# Patient Record
Sex: Male | Born: 1960 | Race: White | Hispanic: No | Marital: Married | Smoking: Never smoker
Health system: Southern US, Community
[De-identification: ages and names within clinical notes are randomized; demographics above are authoritative.]

## PROBLEM LIST (undated history)

## (undated) DIAGNOSIS — E119 Type 2 diabetes mellitus without complications: Secondary | ICD-10-CM

## (undated) DIAGNOSIS — E7849 Other hyperlipidemia: Principal | ICD-10-CM

## (undated) DIAGNOSIS — Z794 Long term (current) use of insulin: Secondary | ICD-10-CM

## (undated) DIAGNOSIS — B349 Viral infection, unspecified: Principal | ICD-10-CM

## (undated) DIAGNOSIS — E113599 Type 2 diabetes mellitus with proliferative diabetic retinopathy without macular edema, unspecified eye: Secondary | ICD-10-CM

## (undated) DIAGNOSIS — I10 Essential (primary) hypertension: Secondary | ICD-10-CM

## (undated) DIAGNOSIS — E1165 Type 2 diabetes mellitus with hyperglycemia: Secondary | ICD-10-CM

## (undated) DIAGNOSIS — I639 Cerebral infarction, unspecified: Secondary | ICD-10-CM

## (undated) DIAGNOSIS — C169 Malignant neoplasm of stomach, unspecified: Secondary | ICD-10-CM

## (undated) DIAGNOSIS — E785 Hyperlipidemia, unspecified: Secondary | ICD-10-CM

## (undated) DIAGNOSIS — Z9889 Other specified postprocedural states: Secondary | ICD-10-CM

## (undated) DIAGNOSIS — M109 Gout, unspecified: Secondary | ICD-10-CM

## (undated) DIAGNOSIS — I629 Nontraumatic intracranial hemorrhage, unspecified: Secondary | ICD-10-CM

## (undated) HISTORY — PX: VASECTOMY: SHX75

## (undated) HISTORY — DX: Type 2 diabetes mellitus without complications: E11.9

## (undated) HISTORY — PX: TONSILLECTOMY: SUR1361

## (undated) HISTORY — DX: Cerebral infarction, unspecified: I63.9

## (undated) HISTORY — DX: Essential (primary) hypertension: I10

## (undated) HISTORY — DX: Hyperlipidemia, unspecified: E78.5

## (undated) HISTORY — DX: Gout, unspecified: M10.9

---

## 1991-11-27 HISTORY — PX: CRANIOTOMY: SHX93

## 2013-10-27 DIAGNOSIS — I10 Essential (primary) hypertension: Secondary | ICD-10-CM

## 2013-10-27 DIAGNOSIS — E119 Type 2 diabetes mellitus without complications: Secondary | ICD-10-CM

## 2013-10-27 DIAGNOSIS — I639 Cerebral infarction, unspecified: Secondary | ICD-10-CM

## 2013-10-27 HISTORY — DX: Type 2 diabetes mellitus without complications: E11.9

## 2013-10-27 HISTORY — DX: Essential (primary) hypertension: I10

## 2013-10-27 HISTORY — DX: Cerebral infarction, unspecified: I63.9

## 2013-11-06 ENCOUNTER — Emergency Department (HOSPITAL_COMMUNITY): Payer: BC Managed Care – PPO

## 2013-11-06 ENCOUNTER — Inpatient Hospital Stay (HOSPITAL_COMMUNITY)
Admission: EM | Admit: 2013-11-06 | Discharge: 2013-11-08 | DRG: 066 | Disposition: A | Discharge status: Home / Self Care | Payer: BC Managed Care – PPO | Attending: Internal Medicine | Admitting: Internal Medicine

## 2013-11-06 ENCOUNTER — Inpatient Hospital Stay (HOSPITAL_COMMUNITY): Payer: BC Managed Care – PPO

## 2013-11-06 ENCOUNTER — Encounter (HOSPITAL_COMMUNITY): Payer: Self-pay | Admitting: Emergency Medicine

## 2013-11-06 DIAGNOSIS — E119 Type 2 diabetes mellitus without complications: Secondary | ICD-10-CM | POA: Diagnosis present

## 2013-11-06 DIAGNOSIS — IMO0002 Reserved for concepts with insufficient information to code with codable children: Secondary | ICD-10-CM | POA: Diagnosis present

## 2013-11-06 DIAGNOSIS — Z79899 Other long term (current) drug therapy: Secondary | ICD-10-CM | POA: Diagnosis not present

## 2013-11-06 DIAGNOSIS — I6330 Cerebral infarction due to thrombosis of unspecified cerebral artery: Secondary | ICD-10-CM

## 2013-11-06 DIAGNOSIS — E1165 Type 2 diabetes mellitus with hyperglycemia: Secondary | ICD-10-CM

## 2013-11-06 DIAGNOSIS — IMO0001 Reserved for inherently not codable concepts without codable children: Secondary | ICD-10-CM

## 2013-11-06 DIAGNOSIS — I10 Essential (primary) hypertension: Secondary | ICD-10-CM

## 2013-11-06 DIAGNOSIS — I6350 Cerebral infarction due to unspecified occlusion or stenosis of unspecified cerebral artery: Principal | ICD-10-CM | POA: Diagnosis present

## 2013-11-06 DIAGNOSIS — Z823 Family history of stroke: Secondary | ICD-10-CM | POA: Diagnosis not present

## 2013-11-06 DIAGNOSIS — I63349 Cerebral infarction due to thrombosis of unspecified cerebellar artery: Secondary | ICD-10-CM

## 2013-11-06 DIAGNOSIS — I639 Cerebral infarction, unspecified: Secondary | ICD-10-CM | POA: Diagnosis present

## 2013-11-06 DIAGNOSIS — H532 Diplopia: Secondary | ICD-10-CM | POA: Diagnosis present

## 2013-11-06 DIAGNOSIS — Z7982 Long term (current) use of aspirin: Secondary | ICD-10-CM | POA: Diagnosis not present

## 2013-11-06 HISTORY — DX: Other specified postprocedural states: Z98.890

## 2013-11-06 LAB — CBC
HCT: 39.5 % (ref 39.0–52.0)
HEMOGLOBIN: 14 g/dL (ref 13.0–17.0)
MCH: 31.5 pg (ref 26.0–34.0)
MCHC: 35.4 g/dL (ref 30.0–36.0)
MCV: 89 fL (ref 78.0–100.0)
Platelets: 247 10*3/uL (ref 150–400)
RBC: 4.44 MIL/uL (ref 4.22–5.81)
RDW: 12.6 % (ref 11.5–15.5)
WBC: 6.5 10*3/uL (ref 4.0–10.5)

## 2013-11-06 LAB — I-STAT CHEM 8, ED
BUN: 8 mg/dL (ref 6–23)
CHLORIDE: 104 meq/L (ref 96–112)
Calcium, Ion: 1.13 mmol/L (ref 1.12–1.23)
Creatinine, Ser: 0.8 mg/dL (ref 0.50–1.35)
GLUCOSE: 156 mg/dL — AB (ref 70–99)
HCT: 46 % (ref 39.0–52.0)
Hemoglobin: 15.6 g/dL (ref 13.0–17.0)
POTASSIUM: 3.9 meq/L (ref 3.7–5.3)
Sodium: 139 mEq/L (ref 137–147)
TCO2: 25 mmol/L (ref 0–100)

## 2013-11-06 LAB — CBG MONITORING, ED: Glucose-Capillary: 145 mg/dL — ABNORMAL HIGH (ref 70–99)

## 2013-11-06 LAB — I-STAT TROPONIN, ED: Troponin i, poc: 0 ng/mL (ref 0.00–0.08)

## 2013-11-06 MED ORDER — SODIUM CHLORIDE 0.9 % IV SOLN
INTRAVENOUS | Status: DC
  Administered 2013-11-06 – 2013-11-07 (×3): via INTRAVENOUS

## 2013-11-06 MED ORDER — STROKE: EARLY STAGES OF RECOVERY BOOK
Freq: Once | Status: DC
  Filled 2013-11-06: qty 1

## 2013-11-06 MED ORDER — HYDRALAZINE HCL 20 MG/ML IJ SOLN: 10.0000 mg | Freq: Four times a day (QID) | INTRAMUSCULAR | Status: DC | PRN

## 2013-11-06 MED ORDER — HEPARIN SODIUM (PORCINE) 5000 UNIT/ML IJ SOLN
5000.0000 [IU] | Freq: Three times a day (TID) | INTRAMUSCULAR | Status: DC
  Administered 2013-11-06 – 2013-11-08 (×5): 5000 [IU] via SUBCUTANEOUS
  Filled 2013-11-06 (×5): qty 1

## 2013-11-06 MED ORDER — ASPIRIN EC 81 MG PO TBEC
81.0000 mg | DELAYED_RELEASE_TABLET | Freq: Every day | ORAL | Status: DC
  Administered 2013-11-07 – 2013-11-08 (×2): 81 mg via ORAL
  Filled 2013-11-06 (×3): qty 1

## 2013-11-06 NOTE — ED Notes (Signed)
Pt on MRI unable to assess pt no on his room.

## 2013-11-06 NOTE — ED Notes (Signed)
Face appears .  He has a sl lt arm drft

## 2013-11-06 NOTE — Consult Note (Signed)
Referring Physician: ED    Chief Complaint: code stroke, acute onset diplopia  HPI:                                                                                                                                         Jacob Underwood is an 53 y.o. male with a past medical history that is relevant for HTN, s/p removal right SDH years ago, brought in as due to acute onset diplopia and code stroke was called. He said that he was doing well until about 1 pm today when he got out of his car and started having double vision that will go away after closing one eye. No associated vertigo, imbalance, difficulty swallowing, focal weakness or numbness, slurred speech, confusion, language impairment, HA, CP, SOB, or palpitations. He called his wife on the phone around 3 pm and she said that " he sound it terrible" but he was not slurring his words. Upon arrival to the ED he was still having diplopia but NIHSS 0 and CT brain showed no acute intracranial abnormality.  Date last known well:  Time last known well:  tPA Given: no, still with diplopia but NIHSS 0. Prior brain surgery excludes him from thrombolysis. NIHSS: 0   Past Medical History  Diagnosis Date  . H/O craniotomy     History reviewed. No pertinent past surgical history.  No family history on file. Social History:  reports that he has never smoked. He does not have any smokeless tobacco history on file. He reports that he does not drink alcohol. His drug history is not on file.  Allergies: Allergies not on file  Medications:                                                                                                                           I have reviewed the patient's current medications.  ROS:  History obtained from the patient, wife, and chart review.  General ROS: negative for - chills, fatigue,  fever, night sweats, or weight loss Psychological ROS: negative for - behavioral disorder, hallucinations, memory difficulties, mood swings or suicidal ideation Ophthalmic ROS: negative for - blurry vision, eye pain or loss of vision ENT ROS: negative for - epistaxis, nasal discharge, oral lesions, sore throat, tinnitus or vertigo Allergy and Immunology ROS: negative for - hives or itchy/watery eyes Hematological and Lymphatic ROS: negative for - bleeding problems, bruising or swollen lymph nodes Endocrine ROS: negative for - galactorrhea, hair pattern changes, polydipsia/polyuria or temperature intolerance Respiratory ROS: negative for - cough, hemoptysis, shortness of breath or wheezing Cardiovascular ROS: negative for - chest pain, dyspnea on exertion, edema or irregular heartbeat Gastrointestinal ROS: negative for - abdominal pain, diarrhea, hematemesis, nausea/vomiting or stool incontinence Genito-Urinary ROS: negative for - dysuria, hematuria, incontinence or urinary frequency/urgency Musculoskeletal ROS: negative for - joint swelling or muscular weakness Neurological ROS: as noted in HPI Dermatological ROS: negative for rash and skin lesion changes  Physical exam: pleasant male in no apparent distress. Blood pressure 152/92, pulse 68, temperature 97.9 F (36.6 C), temperature source Oral, resp. rate 15, height 6' (1.829 m), weight 109.317 kg (241 lb), SpO2 96.00%. Head: normocephalic. Neck: supple, no bruits, no JVD. Cardiac: no murmurs. Lungs: clear. Abdomen: soft, no tender, no mass. Extremities: no edema. Neurologic Examination:                                                                                                      General: Mental Status: Alert, oriented, thought content appropriate.  Speech fluent without evidence of aphasia.  Able to follow 3 step commands without difficulty. Cranial Nerves: II: Discs flat bilaterally; Visual fields grossly normal, pupils equal,  round, reactive to light and accommodation III,IV, VI: ptosis not present, extra-ocular motions intact bilaterally V,VII: smile symmetric, facial light touch sensation normal bilaterally VIII: hearing normal bilaterally IX,X: gag reflex present XI: bilateral shoulder shrug XII: midline tongue extension without atrophy or fasciculations Motor: Right : Upper extremity   5/5    Left:     Upper extremity   5/5  Lower extremity   5/5     Lower extremity   5/5 Tone and bulk:normal tone throughout; no atrophy noted Sensory: Pinprick and light touch intact throughout, bilaterally Deep Tendon Reflexes:  1 all over Plantars: Right: downgoing   Left: downgoing Cerebellar: normal finger-to-nose,  normal heel-to-shin test Gait:  No tested    Results for orders placed during the hospital encounter of 11/06/13 (from the past 48 hour(s))  I-STAT CHEM 8, ED     Status: Abnormal   Collection Time    11/06/13  5:23 PM      Result Value Ref Range   Sodium 139  137 - 147 mEq/L   Potassium 3.9  3.7 - 5.3 mEq/L   Chloride 104  96 - 112 mEq/L   BUN 8  6 - 23 mg/dL   Creatinine, Ser 0.80  0.50 - 1.35 mg/dL   Glucose, Bld 156 (*)  70 - 99 mg/dL   Calcium, Ion 1.13  1.12 - 1.23 mmol/L   TCO2 25  0 - 100 mmol/L   Hemoglobin 15.6  13.0 - 17.0 g/dL   HCT 46.0  39.0 - 52.0 %   Ct Head Wo Contrast  11/06/2013   CLINICAL DATA:  53 year old male with blurred and double vision -code stroke.  EXAM: CT HEAD WITHOUT CONTRAST  TECHNIQUE: Contiguous axial images were obtained from the base of the skull through the vertex without intravenous contrast.  COMPARISON:  None.  FINDINGS: No intracranial abnormalities are identified, including mass lesion or mass effect, hydrocephalus, extra-axial fluid collection, midline shift, hemorrhage, or acute infarction.  Right craniotomy changes noted. No acute bony abnormalities identified.  IMPRESSION: No evidence of intracranial abnormality.  Critical Value/emergent results were  called by telephone at the time of interpretation on 11/06/2013 at 5:25 pm to Dr. Leonard Schwartz , who verbally acknowledged these results.   Electronically Signed   By: Hassan Rowan M.D.   On: 11/06/2013 17:25    Assessment: 53 y.o. male with acute onset binocular diplopia. NIHSS 0. CT brain unremarkable. Concern for brainstem infarct. Admit to medicine and complete stroke work up. Aspirin 81 mg daily after passing swallowing evaluation. Stroke team will follow up tomorrow.  Stroke Risk Factors - HTN  Plan: 1. HgbA1c, fasting lipid panel 2. MRI, MRA  of the brain without contrast 3. Echocardiogram 4. Carotid dopplers 5. Prophylactic therapy-aspirin 6. Risk factor modification 7. Telemetry monitoring 8. Frequent neuro checks 9. PT/OT SLP  Dorian Pod ,MD [facility] [phone]  11/06/2013, 5:32 PM

## 2013-11-06 NOTE — ED Provider Notes (Signed)
CSN: [id number]     Arrival date & time 11/06/13  1651 History   First MD Initiated Contact with Patient 11/06/13 1708     Chief Complaint  Patient presents with  . Code Stroke    @EDPCLEARED @  HPI Patient had sudden onset of double vision which began approximately 4 hours prior to his arrival numbers department.  No previous history in the past.  Otherwise patient has no complaints of weakness or neurological deficits. Past Medical History  Diagnosis Date  . H/O craniotomy    History reviewed. No pertinent past surgical history. History reviewed. No pertinent family history. History  Substance Use Topics  . Smoking status: Never Smoker   . Smokeless tobacco: Not on file  . Alcohol Use: No    Review of Systems  Unable to perform ROS: Acuity of condition     Allergies  Review of patient's allergies indicates not on file.  Home Medications   Prior to Admission medications   Not on File   BP 143/84  Pulse 71  Temp(Src) 98 F (36.7 C) (Oral)  Resp 18  Ht 6' (1.829 m)  Wt 251 lb 15.8 oz (114.3 kg)  BMI 34.17 kg/m2  SpO2 97% Physical Exam Physical Exam  Nursing note and vitals reviewed. Constitutional: He is oriented to person, place, and time. He appears well-developed and well-nourished. No distress.  HENT:  Head: Normocephalic and atraumatic.  Eyes: Pupils are equal, round, and reactive to light.  Neck: Normal range of motion.  Cardiovascular: Normal rate and intact distal pulses.   Pulmonary/Chest: No respiratory distress.  Abdominal: Normal appearance. He exhibits no distension.  Musculoskeletal: Normal range of motion.  Neurological: He is alert and oriented to person, place, and time.  Patient has diplopia which is acute in onset.  Diplopia is through all range of motion of external eye movements.  Patient has no motor or sensory weakness.  Patient has no speech involvement.  Skin: Skin is warm and dry. No rash noted.  Psychiatric: He has a normal mood and  affect. His behavior is normal.   ED Course  Procedures (including critical care time)  Code stroke was called.  Patient seen and evaluated by neurology  CRITICAL CARE Performed by: Ly Bacchi L Total critical care time: 30 min Critical care time was exclusive of separately billable procedures and treating other patients. Critical care was necessary to treat or prevent imminent or life-threatening deterioration. Critical care was time spent personally by me on the following activities: development of treatment plan with patient and/or surrogate as well as nursing, discussions with consultants, evaluation of patient's response to treatment, examination of patient, obtaining history from patient or surrogate, ordering and performing treatments and interventions, ordering and review of laboratory studies, ordering and review of radiographic studies, pulse oximetry and re-evaluation of patient's condition.  Labs Review Labs Reviewed  HEMOGLOBIN A1C - Abnormal; Notable for the following:    Hemoglobin A1C 9.4 (*)    Mean Plasma Glucose 223 (*)    All other components within normal limits  LIPID PANEL - Abnormal; Notable for the following:    Triglycerides 373 (*)    HDL 26 (*)    VLDL 75 (*)    All other components within normal limits  I-STAT CHEM 8, ED - Abnormal; Notable for the following:    Glucose, Bld 156 (*)    All other components within normal limits  CBG MONITORING, ED - Abnormal; Notable for the following:    Glucose-Capillary 145 (*)  All other components within normal limits  CBC  CBC  CREATININE, SERUM  HEMOGLOBIN A1C  I-STAT TROPOININ, ED    Imaging Review Dg Chest 2 View  11/07/2013   CLINICAL DATA:  Blurry vision.  Possible stroke.  EXAM: CHEST  2 VIEW  COMPARISON:  None.  FINDINGS: The cardiac silhouette, mediastinal and hilar contours are normal. The lungs are clear. No pleural effusion. The bony thorax is intact.  IMPRESSION: No acute cardiopulmonary  findings.   Electronically Signed   By: Kalman Jewels M.D.   On: 11/07/2013 00:48   Ct Head Wo Contrast  11/06/2013   CLINICAL DATA:  53 year old male with blurred and double vision -code stroke.  EXAM: CT HEAD WITHOUT CONTRAST  TECHNIQUE: Contiguous axial images were obtained from the base of the skull through the vertex without intravenous contrast.  COMPARISON:  None.  FINDINGS: No intracranial abnormalities are identified, including mass lesion or mass effect, hydrocephalus, extra-axial fluid collection, midline shift, hemorrhage, or acute infarction.  Right craniotomy changes noted. No acute bony abnormalities identified.  IMPRESSION: No evidence of intracranial abnormality.  Critical Value/emergent results were called by telephone at the time of interpretation on 11/06/2013 at 5:25 pm to Dr. Leonard Schwartz , who verbally acknowledged these results.   Electronically Signed   By: Hassan Rowan M.D.   On: 11/06/2013 17:25   Mr Jodene Nam Head Wo Contrast  11/07/2013   ADDENDUM REPORT: 11/07/2013 09:48  ADDENDUM: After further review of the images, there is a small 3 mm acute infarct in the right posterior midbrain just below the aqueduct. This is consistent with the patient's blurred vision and diplopia.  These results were called by telephone at the time of interpretation on 11/07/2013 at 9:47 am to Dr. Freddy Jaksch , who verbally acknowledged these results.   Electronically Signed   By: Franchot Gallo M.D.   On: 11/07/2013 09:48   11/07/2013   CLINICAL DATA:  Stroke.  Blurred vision.  EXAM: MRI HEAD WITHOUT CONTRAST  MRA HEAD WITHOUT CONTRAST  TECHNIQUE: Multiplanar, multiecho pulse sequences of the brain and surrounding structures were obtained without intravenous contrast. Angiographic images of the head were obtained using MRA technique without contrast.  COMPARISON:  CT head 11/06/2013  FINDINGS: MRI HEAD FINDINGS  Negative for acute infarct  Negative for chronic ischemia  Negative for demyelinating disease.  Negative  for hemorrhage or fluid collection. Negative for mass or edema.  Pituitary normal in size.  Cranial cervical junction normal  Paranasal sinuses are clear.  MRA HEAD FINDINGS  Left vertebral artery widely patent to the basilar. Left PICA patent. Right vertebral artery is hypoplastic and ends in PICA. Basilar widely patent. Fetal origin left posterior cerebral artery with hypoplastic left P1 segment. Both posterior cerebral arteries are patent without stenosis  Internal carotid artery patent bilaterally. Anterior and middle cerebral arteries are patent without significant stenosis.  Negative for cerebral aneurysm.  IMPRESSION: Negative for acute infarct.  Negative MRI of the head  Negative MRA of the head.  Electronically Signed: By: Franchot Gallo M.D. 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 This is consistent with the patient's blurred vision and diplopia.  These results were called by telephone at the time of interpretation on 11/07/2013 at 9:47 am to Dr. Freddy Jaksch , who verbally acknowledged  these results.   Electronically Signed   By: Franchot Gallo M.D.   On: 11/07/2013 09:48   11/07/2013   CLINICAL DATA:  Stroke.  Blurred vision.  EXAM: MRI HEAD WITHOUT CONTRAST  MRA HEAD WITHOUT CONTRAST  TECHNIQUE: Multiplanar, multiecho pulse sequences of the brain and surrounding structures were obtained without intravenous contrast. Angiographic images of the head were obtained using MRA technique without contrast.  COMPARISON:  CT head 11/06/2013  FINDINGS: MRI HEAD FINDINGS  Negative for acute infarct  Negative for chronic ischemia  Negative for demyelinating disease.  Negative for hemorrhage or fluid collection. Negative for mass or edema.  Pituitary normal in size.  Cranial cervical junction normal  Paranasal sinuses are clear.   MRA HEAD FINDINGS  Left vertebral artery widely patent to the basilar. Left PICA patent. Right vertebral artery is hypoplastic and ends in PICA. Basilar widely patent. Fetal origin left posterior cerebral artery with hypoplastic left P1 segment. Both posterior cerebral arteries are patent without stenosis  Internal carotid artery patent bilaterally. Anterior and middle cerebral arteries are patent without significant stenosis.  Negative for cerebral aneurysm.  IMPRESSION: Negative for acute infarct.  Negative MRI of the head  Negative MRA of the head.  Electronically Signed: By: Franchot Gallo M.D. On: 11/06/2013 20:09     EKG Interpretation   Date/Time:  Friday November 06 2013 17:22:05 EDT Ventricular Rate:  71 PR Interval:  160 QRS Duration: 92 QT Interval:  392 QTC Calculation: 426 R Axis:   78 Text Interpretation:  Sinus rhythm Normal ECG No previous tracing  Confirmed by Orland Visconti  MD, Kirkland Figg ([id number]) on 11/06/2013 5:28:27 PM     Code Stroke called.  Neurology saw and guided recommendations MDM   Final diagnoses:  Diplopia        Dot Lanes, MD 11/07/13 1058
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
## 2013-11-06 NOTE — ED Notes (Signed)
CBG 145  

## 2013-11-06 NOTE — ED Notes (Signed)
Patient transported to MRI 

## 2013-11-06 NOTE — ED Notes (Signed)
The pt has had blurred vision in his rt eye 4 hours ago and when both his eyes are open he sees double.  noi npoain anywhere
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
## 2013-11-06 NOTE — ED Notes (Signed)
Dr Audie Pinto saw pt in triage and called a code stroke back to c-t initial triage time

## 2013-11-07 ENCOUNTER — Encounter (HOSPITAL_COMMUNITY): Payer: Self-pay | Admitting: *Deleted

## 2013-11-07 DIAGNOSIS — E1165 Type 2 diabetes mellitus with hyperglycemia: Secondary | ICD-10-CM

## 2013-11-07 DIAGNOSIS — I10 Essential (primary) hypertension: Secondary | ICD-10-CM

## 2013-11-07 DIAGNOSIS — IMO0001 Reserved for inherently not codable concepts without codable children: Secondary | ICD-10-CM

## 2013-11-07 DIAGNOSIS — I6789 Other cerebrovascular disease: Secondary | ICD-10-CM

## 2013-11-07 DIAGNOSIS — I517 Cardiomegaly: Secondary | ICD-10-CM

## 2013-11-07 LAB — CREATININE, SERUM
Creatinine, Ser: 0.63 mg/dL (ref 0.50–1.35)
GFR calc Af Amer: >90 mL/min (ref >90)
GFR calc non Af Amer: >90 mL/min (ref >90)

## 2013-11-07 LAB — CBC
HCT: 40.4 % (ref 39.0–52.0)
Hemoglobin: 14.5 g/dL (ref 13.0–17.0)
MCH: 31.1 pg (ref 26.0–34.0)
MCHC: 35.9 g/dL (ref 30.0–36.0)
MCV: 86.7 fL (ref 78.0–100.0)
PLATELETS: 254 10*3/uL (ref 150–400)
RBC: 4.66 MIL/uL (ref 4.22–5.81)
RDW: 12.5 % (ref 11.5–15.5)
WBC: 6.5 10*3/uL (ref 4.0–10.5)

## 2013-11-07 LAB — LIPID PANEL
CHOLESTEROL: 165 mg/dL (ref 0–200)
HDL: 26 mg/dL — ABNORMAL LOW (ref >39)
LDL Cholesterol: 64 mg/dL (ref 0–99)
Total CHOL/HDL Ratio: 6.3 RATIO
Triglycerides: 373 mg/dL — ABNORMAL HIGH (ref <150)
VLDL: 75 mg/dL — ABNORMAL HIGH (ref 0–40)

## 2013-11-07 LAB — HEMOGLOBIN A1C
HEMOGLOBIN A1C: 9.4 % — AB (ref <5.7)
Hgb A1c MFr Bld: 9.4 % — ABNORMAL HIGH (ref <5.7)
MEAN PLASMA GLUCOSE: 223 mg/dL — AB (ref <117)
Mean Plasma Glucose: 223 mg/dL — ABNORMAL HIGH (ref <117)

## 2013-11-07 MED ORDER — ASPIRIN EC 325 MG PO TBEC: 325.0000 mg | DELAYED_RELEASE_TABLET | Freq: Every day | ORAL | Status: DC

## 2013-11-07 MED ORDER — METFORMIN HCL 500 MG PO TABS: 500.0000 mg | ORAL_TABLET | Freq: Every day | ORAL | Status: DC

## 2013-11-07 MED ORDER — LISINOPRIL 10 MG PO TABS: 10.0000 mg | ORAL_TABLET | Freq: Every day | ORAL | Status: DC

## 2013-11-07 NOTE — Progress Notes (Signed)
PT Cancellation Note  Patient Details Name: Jacob Underwood MRN: [id number] DOB: 08-14-1960   Cancelled Treatment:    Reason Eval/Treat Not Completed: PT screened, no needs identified, will sign off. Spoke with OT. Pt at baseline from mobility standpoint. No acute PT needs warranted at this time. Will sign off. Thanks.    Elie Confer [place], [place] 11/07/2013, 2:57 PM

## 2013-11-07 NOTE — Progress Notes (Addendum)
Occupational Therapy Evaluation Patient Details Name: Jacob Underwood MRN: [id number] DOB: 1960/12/06 Today's Date: 11/07/2013    History of Present Illness 53 yo who presents with c/o double vision. Pt with small infarct in R posterior midbrain.   Clinical Impression   PTA, pt independent with all ADL and mobility. Pt presents with c/o diplopia, which disappears with R sup gaze. Pt also demonstrates difficulty with horizontal gaze stability. Used spot occlusion over R lens to eliminate double image. Pt reports image disappears with use of occlusion. Will plan to order eye patch to alternate q 2 hrs and use in conjunction with spot occlusion therapeutically. Pt will need to follow up with the OT at the neuro outpt center. Educated pt/family on warning signs/symptoms of CVA, including "FAST". Discussed need for pt to refrain from driving at this time until cleared by his MD.     Follow Up Recommendations  Outpatient OT;Supervision - Intermittent (neuro outpt OT)    Equipment Recommendations  None recommended by OT    Recommendations for Other Services       Precautions / Restrictions        Mobility Bed Mobility                  Transfers Overall transfer level: Needs assistance   Transfers: Sit to/from Stand Sit to Stand: Modified independent (Device/Increase time)         General transfer comment: S with mobility without eye closed.  "furniture walking"    Balance Overall balance assessment: No apparent balance deficits (not formally assessed)                                          ADL Overall ADL's : Needs assistance/impaired Eating/Feeding: Independent   Grooming: Supervision/safety   Upper Body Bathing: Supervision/ safety   Lower Body Bathing: Supervison/ safety   Upper Body Dressing : Supervision/safety   Lower Body Dressing: Supervision/safety   Toilet Transfer: Supervision/safety   Toileting- Clothing Manipulation and Hygiene:  Modified independent       Functional mobility during ADLs: Supervision/safety General ADL Comments: Pt requires S due to diplopia     Vision Eye Alignment: Impaired (comment) Alignment/Gaze Preference: Head tilt Ocular Range of Motion: Restricted looking down;Restricted on the right Tracking/Visual Pursuits: Right eye does not track medially;Decreased smoothness of horizontal tracking Saccades: Decreased speed of saccadic movement;Impaired - to be further tested in functional context Convergence: Impaired (comment) Diplopia Assessment: Disappears with one eye closed;Objects split side to side;Present in primary gaze;Present in far gaze;Only with left gaze (disappears with R and superior gaze) Depth Perception: Undershoots Additional Comments: /during cover/uncover test, pt demonstrates L eye lateral deviation; difficulty with gaze control   Perception     Praxis      Pertinent Vitals/Pain Pain Assessment: No/denies pain     Hand Dominance Right   Extremity/Trunk Assessment Upper Extremity Assessment Upper Extremity Assessment: Overall WFL for tasks assessed   Lower Extremity Assessment Lower Extremity Assessment: Overall WFL for tasks assessed   Cervical / Trunk Assessment Cervical / Trunk Assessment: Normal   Communication Communication Communication: No difficulties   Cognition Arousal/Alertness: Awake/alert Behavior During Therapy: WFL for tasks assessed/performed Overall Cognitive Status: Within Functional Limits for tasks assessed                     General Comments  Exercises       Shoulder Instructions      Home Living Family/patient expects to be discharged to:: Private residence Living Arrangements: Spouse/significant other Available Help at Discharge: Family;Available 24 hours/day Type of Home: House Home Access: Stairs to enter     Home Layout: One level    13 steps. Rail. Walk inshower Bedrooms upstairs                       Prior Functioning/Environment Level of Independence: Independent             OT Diagnosis: Disturbance of vision   OT Problem List: Impaired vision/perception;Decreased safety awareness   OT Treatment/Interventions: Self-care/ADL training;Therapeutic exercise;Therapeutic activities;Visual/perceptual remediation/compensation;Patient/family education    OT Goals(Current goals can be found in the care plan section) Acute Rehab OT Goals Patient Stated Goal: to not have double vision OT Goal Formulation: With patient Time For Goal Achievement: 11/14/13 Potential to Achieve Goals: Good  OT Frequency: Min 2X/week   Barriers to D/C:            Co-evaluation              End of Session Nurse Communication: Mobility status;Other (comment) (need for eye patch)  Activity Tolerance: Patient tolerated treatment well Patient left: in bed;with call bell/phone within reach;with bed alarm set;with family/visitor present   Time: 5053-9767 OT Time Calculation (min): 30 min Charges:  OT General Charges $OT Visit: 1 Procedure OT Evaluation $Initial OT Evaluation Tier I: 1 Procedure OT Treatments $Therapeutic Activity: 23-37 mins G-Codes:    Trica Usery,HILLARY 11/09/2013, 2:36 PM   [facility], OTR/L  [phone] 2013/11/09
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
## 2013-11-07 NOTE — Progress Notes (Signed)
VASCULAR LAB PRELIMINARY  PRELIMINARY  PRELIMINARY  PRELIMINARY  Carotid Dopplers completed.    Preliminary report:  1-3*% ICA stenosis.  Vertebral artery flow is antegrade.   Jacob Underwood, RVT 11/07/2013, 11:56 AM

## 2013-11-07 NOTE — Progress Notes (Signed)
Occupational Therapy Treatment Patient Details Name: Jacob Underwood MRN: [id number] DOB: 08-Oct-1960 Today's Date: 11/07/2013    History of present illness 53 yo who presetns with c/o double vision. Pt with small infarct in R posterior midbrain.   OT comments  Pt continues to complain of diplpia. Completed education with pt regarding use of spot occlusion glasses in conjunction with alternating eye patch. Pt began gaze stabilization exercises, in addition to working on pursuits and saccades. Will need intermittent S. Will need to follow up with the neuro outpt center.   Follow Up Recommendations  Outpatient OT;Supervision - Intermittent    Equipment Recommendations  None recommended by OT    Recommendations for Other Services      Precautions / Restrictions Precautions Precautions: Other (comment) (diplopia)       Mobility Bed Mobility Overal bed mobility: Modified Independent                Transfers Overall transfer level: Needs assistance Equipment used: 1 person hand held assist Transfers: Sit to/from [facility] Sit to Stand: Supervision Stand pivot transfers: Supervision       General transfer comment: S with mobility without eye closed.     Balance Overall balance assessment: Needs assistance         Standing balance support: During functional activity Standing balance-Leahy Scale: Fair Standing balance comment: S. educated pt on increasing safety with mobility for self care                   ADL Overall ADL's : Needs assistance/impaired Eating/Feeding: Independent   Grooming: Supervision/safety   Upper Body Bathing: Supervision/ safety   Lower Body Bathing: Supervison/ safety   Upper Body Dressing : Supervision/safety   Lower Body Dressing: Supervision/safety   Toilet Transfer: Supervision/safety   Toileting- Clothing Manipulation and Hygiene: Modified independent       Functional mobility during ADLs:  Supervision/safety General ADL Comments: Educated pt/wife on home safety and need to remove clutter/throw rugs and use of eye patch when ambulating. Need to refrain from cooking and use of power tolls.      Vision Eye Alignment: Impaired (comment) Alignment/Gaze Preference: Head tilt Ocular Range of Motion: Restricted looking down;Restricted on the right Tracking/Visual Pursuits: Right eye does not track medially;Decreased smoothness of horizontal tracking Saccades: Decreased speed of saccadic movement;Impaired - to be further tested in functional context Convergence: Impaired (comment) Diplopia Assessment: Disappears with one eye closed;Objects split side to side;Present in primary gaze;Present in far gaze;Only with left gaze (disappears with R and superior gaze) Depth Perception: Undershoots Additional Comments: Pt educated on use of eye patch in conjunction with use of spot occlusion. to alternate patch q 2 hrs, with wearing spot occlusion glasses for 1 hour in between. Also educated pt on visual fixation exercises and began working on saccades and pursuits   Perception     Praxis      Cognition   Behavior During Therapy: WFL for tasks assessed/performed Overall Cognitive Status: Within Functional Limits for tasks assessed                       Extremity/Trunk Assessment  Upper Extremity Assessment Upper Extremity Assessment: Overall WFL for tasks assessed   Lower Extremity Assessment Lower Extremity Assessment: Overall WFL for tasks assessed   Cervical / Trunk Assessment Cervical / Trunk Assessment: Normal    Exercises     Shoulder Instructions       General Comments  Pertinent Vitals/ Pain       Pain Assessment: No/denies pain  Home Living Family/patient expects to be discharged to:: Private residence Living Arrangements: Spouse/significant other Available Help at Discharge: Family;Available 24 hours/day Type of Home: House Home Access: Stairs to  enter     Home Layout: One level                          Prior Functioning/Environment Level of Independence: Independent            Frequency Min 2X/week     Progress Toward Goals  OT Goals(current goals can now be found in the care plan section)  Progress towards OT goals: Progressing toward goals  Acute Rehab OT Goals Patient Stated Goal: to not have double vision OT Goal Formulation: With patient Time For Goal Achievement: 11/14/13 Potential to Achieve Goals: Good ADL Goals Additional ADL Goal #1: Pt will verbalize understanding of use of spot oclsuion with eye patch to reduce symptoms of diplopia Additional ADL Goal #2: pt/family will verbalize understanding of safe set up for ADL and mobility  at home  Plan Discharge plan remains appropriate    Co-evaluation                 End of Session Equipment Utilized During Treatment: Gait belt   Activity Tolerance Patient tolerated treatment well   Patient Left in bed;with call bell/phone within reach;with bed alarm set;with family/visitor present   Nurse Communication Mobility status;Other (comment)        Time: 6834-1962 OT Time Calculation (min): 28 min  Charges: OT General Charges $OT Visit: 1 Procedure  OT Treatments $Therapeutic Activity: 23-37 mins  Yanett Conkright,HILLARY 11/07/2013, 5:42 PM   [facility], OTR/L  [phone] 11/07/2013

## 2013-11-07 NOTE — Progress Notes (Signed)
NEURO HOSPITALIST PROGRESS NOTE   SUBJECTIVE:                                                                                                                        In good spirits, family at the bedside. Still with double vision but offers no other neurological complains. 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 MRA brain negative. On aspirin.  OBJECTIVE:                                                                                                                           Vital signs in last 24 hours: Temp:  [97.7 F (36.5 C)-98.2 F (36.8 C)] 97.9 F (36.6 C) (09/12 0741) Pulse Rate:  [58-68] 64 (09/12 0741) Resp:  [15-20] 20 (09/12 0741) BP: (126-157)/(75-99) 141/83 mmHg (09/12 0741) SpO2:  [96 %-98 %] 96 % (09/12 0741) Weight:  [109.317 kg (241 lb)-114.3 kg (251 lb 15.8 oz)] 114.3 kg (251 lb 15.8 oz) (09/11 2033)  Intake/Output from previous day:   Intake/Output this shift:   Nutritional status: General  Past Medical History  Diagnosis Date  . H/O craniotomy     Neurologic Exam:  Mental Status:  Alert, oriented, thought content appropriate. Speech fluent without evidence of aphasia. Able to follow 3 step commands without difficulty.  Cranial Nerves:  II: Discs flat bilaterally; Visual fields grossly normal, pupils equal, round, reactive to light and accommodation  III,IV, VI: ptosis not present, can not adduct right eye, which was not present in the initial ED assessment.  V,VII: smile symmetric, facial light touch sensation normal bilaterally  VIII: hearing normal bilaterally  IX,X: gag reflex present  XI: bilateral shoulder shrug  XII: midline tongue extension without atrophy or fasciculations  Motor:  Right : Upper extremity 5/5 Left: Upper extremity 5/5  Lower extremity 5/5 Lower extremity 5/5  Tone and bulk:normal tone throughout; no atrophy noted  Sensory:  Pinprick and light touch intact throughout, bilaterally  Deep Tendon Reflexes:  1 all over  Plantars:  Right: downgoing Left: downgoing  Cerebellar:  normal finger-to-nose, normal heel-to-shin test  Gait:  No tested   Lab Results: Lab Results  Component Value Date/Time  CHOL 165 11/07/2013  6:19 AM   Lipid Panel  Recent Labs  11/07/13 0619  CHOL 165  TRIG 373*  HDL 26*  CHOLHDL 6.3  VLDL 75*  LDLCALC 64    Studies/Results: Dg Chest 2 View  11/07/2013   CLINICAL DATA:  Blurry vision.  Possible stroke.  EXAM: CHEST  2 VIEW  COMPARISON:  None.  FINDINGS: The cardiac silhouette, mediastinal and hilar contours are normal. The lungs are clear. No pleural effusion. The bony thorax is intact.  IMPRESSION: No acute cardiopulmonary findings.   Electronically Signed   By: Kalman Jewels M.D.   On: 11/07/2013 00:48   Ct Head Wo Contrast  11/06/2013   CLINICAL DATA:  53 year old male with blurred and double vision -code stroke.  EXAM: CT HEAD WITHOUT CONTRAST  TECHNIQUE: Contiguous axial images were obtained from the base of the skull through the vertex without intravenous contrast.  COMPARISON:  None.  FINDINGS: No intracranial abnormalities are identified, including mass lesion or mass effect, hydrocephalus, extra-axial fluid collection, midline shift, hemorrhage, or acute infarction.  Right craniotomy changes noted. No acute bony abnormalities identified.  IMPRESSION: No evidence of intracranial abnormality.  Critical Value/emergent results were called by telephone at the time of interpretation on 11/06/2013 at 5:25 pm to Dr. Leonard Schwartz , who verbally acknowledged these results.   Electronically Signed   By: Hassan Rowan M.D.   On: 11/06/2013 17:25   Mr Jodene Nam Head Wo Contrast  11/07/2013   ADDENDUM REPORT: 11/07/2013 09:48  ADDENDUM: After further review of the images, there is a small 3 mm acute infarct in the right posterior midbrain just below the aqueduct. This is consistent with the  patient's blurred vision and diplopia.  These results were called by telephone at the time of interpretation on 11/07/2013 at 9:47 am to Dr. Freddy Jaksch , who verbally acknowledged these results.   Electronically Signed   By: Franchot Gallo M.D.   On: 11/07/2013 09:48   11/07/2013   CLINICAL DATA:  Stroke.  Blurred vision.  EXAM: MRI HEAD WITHOUT CONTRAST  MRA HEAD WITHOUT CONTRAST  TECHNIQUE: Multiplanar, multiecho pulse sequences of the brain and surrounding structures were obtained without intravenous contrast. Angiographic images of the head were obtained using MRA technique without contrast.  COMPARISON:  CT head 11/06/2013  FINDINGS: MRI HEAD FINDINGS  Negative for acute infarct  Negative for chronic ischemia  Negative for demyelinating disease.  Negative for hemorrhage or fluid collection. Negative for mass or edema.  Pituitary normal in size.  Cranial cervical junction normal  Paranasal sinuses are clear.  MRA HEAD FINDINGS  Left vertebral artery widely patent to the basilar. Left PICA patent. Right vertebral artery is hypoplastic and ends in PICA. Basilar widely patent. Fetal origin left posterior cerebral artery with hypoplastic left P1 segment. Both posterior cerebral arteries are patent without stenosis  Internal carotid artery patent bilaterally. Anterior and middle cerebral arteries are patent without significant stenosis.  Negative for cerebral aneurysm.  IMPRESSION: Negative for acute infarct.  Negative MRI of the head  Negative MRA of the head.  Electronically Signed: By: Franchot Gallo M.D. 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 This is consistent with the patient's blurred vision and diplopia.  These results were called by telephone at the time of interpretation  on 11/07/2013 at 9:47 am to Dr. Freddy Jaksch , who  verbally acknowledged these results.   Electronically Signed   By: Franchot Gallo M.D.   On: 11/07/2013 09:48   11/07/2013   CLINICAL DATA:  Stroke.  Blurred vision.  EXAM: MRI HEAD WITHOUT CONTRAST  MRA HEAD WITHOUT CONTRAST  TECHNIQUE: Multiplanar, multiecho pulse sequences of the brain and surrounding structures were obtained without intravenous contrast. Angiographic images of the head were obtained using MRA technique without contrast.  COMPARISON:  CT head 11/06/2013  FINDINGS: MRI HEAD FINDINGS  Negative for acute infarct  Negative for chronic ischemia  Negative for demyelinating disease.  Negative for hemorrhage or fluid collection. Negative for mass or edema.  Pituitary normal in size.  Cranial cervical junction normal  Paranasal sinuses are clear.  MRA HEAD FINDINGS  Left vertebral artery widely patent to the basilar. Left PICA patent. Right vertebral artery is hypoplastic and ends in PICA. Basilar widely patent. Fetal origin left posterior cerebral artery with hypoplastic left P1 segment. Both posterior cerebral arteries are patent without stenosis  Internal carotid artery patent bilaterally. Anterior and middle cerebral arteries are patent without significant stenosis.  Negative for cerebral aneurysm.  IMPRESSION: Negative for acute infarct.  Negative MRI of the head  Negative MRA of the head.  Electronically Signed: By: Franchot Gallo M.D. On: 11/06/2013 20:09    MEDICATIONS                                                                                                                        Scheduled: .  stroke: mapping our early stages of recovery book   Does not apply Once  . aspirin EC  81 mg Oral Daily  . heparin  5,000 Units Subcutaneous 3 times per day    ASSESSMENT/PLAN:                                                                                                           54 y/o with HTN, admitted yesterday with acute onset binocular diplopia. 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 On aspirin. Completer stroke work up. Stroke team will follow up in am.   Dorian Pod, MD [facility] [phone]  11/07/2013, 10:01 AM

## 2013-11-07 NOTE — Progress Notes (Signed)
  Echocardiogram 2D Echocardiogram has been performed.  Jacob Underwood 11/07/2013, 5:40 PM

## 2013-11-07 NOTE — Progress Notes (Signed)
[facility]                                                                              Patient Demographics  Jacob Underwood, is a 53 y.o. male, DOB - 1960-05-10, GMW:[id number]  Admit date - 11/06/2013   Admitting Physician Thurnell Lose, MD  Outpatient Primary MD for the patient is No PCP Per Patient  LOS - 1   Chief Complaint  Patient presents with  . Code Stroke        Assessment & Plan   Acute CVA  -CT of the head:No evidence of intracranial abnormality.  -YQI:HKVQQ 3 mm acute infarct in the right posterior midbrain just below the aqueduct  -Carotid Doppler: 1-39% ICA stenosis. Vertebral artery flow is antegrade  -Echocardiogram pending  -LDL 64 , hemoglobin A1c 9.4  -Neurology consulted and appreciated and recommended full dose aspirin  -PT was consulted and patient does not need physical therapy as an outpatient  -OT was consulted and recommended outpatient OT  Hypertension  -Was on no medications at home  -Patient will be started on lisinopril. This should be discussed with his primary care physician.  History of craniotomy  -Stable  Newly diagnosed Diabetes  -Hemoglobin A1c 9.4  -Start patient on metformin, however daily due to CBGs not being elevated  -He should discuss diabetes management with primary care physician.  Code Status: Full  Family Communication: Wife at bedside  Disposition Plan: Admitted, pending work up  Time Spent in minutes  30 minutes  Procedures  Carotid Doppler Echocardiogram  Consults   Neurlogy  DVT Prophylaxis  Heparin  Lab Results  Component Value Date   PLT 254 11/06/2013    Medications  Scheduled Meds: .  stroke: mapping our early stages of recovery book   Does not apply Once  . aspirin EC  81 mg Oral Daily  . heparin  5,000 Units Subcutaneous 3 times per day   Continuous Infusions: . sodium chloride 100 mL/hr at 11/07/13 0827   PRN Meds:.hydrALAZINE Antibiotics    Anti-infectives   None         Subjective:   [name] seen and examined today.  Patient still complains of blurry vision.  Denies dizziness, headache, SOB, CP.   Objective:   Filed Vitals:   11/07/13 0741 11/07/13 1011 11/07/13 1427 11/07/13 1746  BP: 141/83 143/84 149/77 167/87  Pulse: 64 71 72 83  Temp: 97.9 F (36.6 C) 98 F (36.7 C) 97.8 F (36.6 C) 98.4 F (36.9 C)  TempSrc: Oral Oral Oral Oral  Resp: 20 18 20 18   Height:      Weight:      SpO2: 96% 97% 95% 97%    Wt Readings from Last 3 Encounters:  11/06/13 114.3 kg (251 lb 15.8 oz)     Intake/Output Summary (Last 24 hours) at 11/07/13 1755 Last data filed at 11/07/13 5956  Gross per 24 hour  Intake    240 ml  Output      0 ml  Net    240 ml    Exam  General: Well developed, well nourished, NAD, appears stated age  HEENT: NCAT, PERRLA, Anicteic Sclera, mucous membranes  moist.   Cardiovascular: S1 S2 auscultated, no rubs, murmurs or gallops. Regular rate and rhythm.  Respiratory: Clear to auscultation bilaterally with equal chest rise  Abdomen: Soft, nontender, nondistended, + bowel sounds  Extremities: warm dry without cyanosis clubbing or edema  Neuro: AAOx3, cranial nerves grossly intact. Strength 5/5 in patient's upper and lower extremities bilaterally  Skin: Without rashes exudates or nodules  Psych: Normal affect and demeanor with intact judgement and insight   Data Review   Micro Results No results found for this or any previous visit (from the past 240 hour(s)).  Radiology Reports Dg Chest 2 View  11/07/2013   CLINICAL DATA:  Blurry vision.  Possible stroke.  EXAM: CHEST  2 VIEW  COMPARISON:  None.  FINDINGS: The cardiac silhouette, mediastinal and hilar contours are normal. The lungs are clear. No pleural effusion. The bony thorax is intact.  IMPRESSION: No acute cardiopulmonary findings.   Electronically Signed   By: Kalman Jewels M.D.   On: 11/07/2013 00:48   Ct Head Wo Contrast  11/06/2013   CLINICAL  DATA:  53 year old male with blurred and double vision -code stroke.  EXAM: CT HEAD WITHOUT CONTRAST  TECHNIQUE: Contiguous axial images were obtained from the base of the skull through the vertex without intravenous contrast.  COMPARISON:  None.  FINDINGS: No intracranial abnormalities are identified, including mass lesion or mass effect, hydrocephalus, extra-axial fluid collection, midline shift, hemorrhage, or acute infarction.  Right craniotomy changes noted. No acute bony abnormalities identified.  IMPRESSION: No evidence of intracranial abnormality.  Critical Value/emergent results were called by telephone at the time of interpretation on 11/06/2013 at 5:25 pm to Dr. Leonard Schwartz , who verbally acknowledged these results.   Electronically Signed   By: Hassan Rowan M.D.   On: 11/06/2013 17:25   Mr Jodene Nam Head Wo Contrast  11/07/2013   ADDENDUM REPORT: 11/07/2013 09:48  ADDENDUM: After further review of the images, there is a small 3 mm acute infarct in the right posterior midbrain just below the aqueduct. This is consistent with the patient's blurred vision and diplopia.  These results were called by telephone at the time of interpretation on 11/07/2013 at 9:47 am to Dr. Freddy Jaksch , who verbally acknowledged these results.   Electronically Signed   By: Franchot Gallo M.D.   On: 11/07/2013 09:48   11/07/2013   CLINICAL DATA:  Stroke.  Blurred vision.  EXAM: MRI HEAD WITHOUT CONTRAST  MRA HEAD WITHOUT CONTRAST  TECHNIQUE: Multiplanar, multiecho pulse sequences of the brain and surrounding structures were obtained without intravenous contrast. Angiographic images of the head were obtained using MRA technique without contrast.  COMPARISON:  CT head 11/06/2013  FINDINGS: MRI HEAD FINDINGS  Negative for acute infarct  Negative for chronic ischemia  Negative for demyelinating disease.  Negative for hemorrhage or fluid collection. Negative for mass or edema.  Pituitary normal in size.  Cranial cervical junction normal   Paranasal sinuses are clear.  MRA HEAD FINDINGS  Left vertebral artery widely patent to the basilar. Left PICA patent. Right vertebral artery is hypoplastic and ends in PICA. Basilar widely patent. Fetal origin left posterior cerebral artery with hypoplastic left P1 segment. Both posterior cerebral arteries are patent without stenosis  Internal carotid artery patent bilaterally. Anterior and middle cerebral arteries are patent without significant stenosis.  Negative for cerebral aneurysm.  IMPRESSION: Negative for acute infarct.  Negative MRI of the head  Negative MRA of the head.  Electronically Signed: By: Franchot Gallo  M.D. 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 This is consistent with the patient's blurred vision and diplopia.  These results were called by telephone at the time of interpretation on 11/07/2013 at 9:47 am to Dr. Freddy Jaksch , who verbally acknowledged these results.   Electronically Signed   By: Franchot Gallo M.D.   On: 11/07/2013 09:48   11/07/2013   CLINICAL DATA:  Stroke.  Blurred vision.  EXAM: MRI HEAD WITHOUT CONTRAST  MRA HEAD WITHOUT CONTRAST  TECHNIQUE: Multiplanar, multiecho pulse sequences of the brain and surrounding structures were obtained without intravenous contrast. Angiographic images of the head were obtained using MRA technique without contrast.  COMPARISON:  CT head 11/06/2013  FINDINGS: MRI HEAD FINDINGS  Negative for acute infarct  Negative for chronic ischemia  Negative for demyelinating disease.  Negative for hemorrhage or fluid collection. Negative for mass or edema.  Pituitary normal in size.  Cranial cervical junction normal  Paranasal sinuses are clear.  MRA HEAD FINDINGS  Left vertebral artery widely patent to the basilar. Left PICA patent. Right vertebral artery is hypoplastic  and ends in PICA. Basilar widely patent. Fetal origin left posterior cerebral artery with hypoplastic left P1 segment. Both posterior cerebral arteries are patent without stenosis  Internal carotid artery patent bilaterally. Anterior and middle cerebral arteries are patent without significant stenosis.  Negative for cerebral aneurysm.  IMPRESSION: Negative for acute infarct.  Negative MRI of the head  Negative MRA of the head.  Electronically Signed: By: Franchot Gallo M.D. On: 11/06/2013 20:09    CBC  Recent Labs Lab 11/06/13 1723 11/06/13 1757 11/06/13 2341  WBC  --  6.5 6.5  HGB 15.6 14.0 14.5  HCT 46.0 39.5 40.4  PLT  --  247 254  MCV  --  89.0 86.7  MCH  --  31.5 31.1  MCHC  --  35.4 35.9  RDW  --  12.6 12.5    Chemistries   Recent Labs Lab 11/06/13 1723 11/06/13 2341  NA 139  --   K 3.9  --   CL 104  --   GLUCOSE 156*  --   BUN 8  --   CREATININE 0.80 0.63   ------------------------------------------------------------------------------------------------------------------ estimated creatinine clearance is 142.6 ml/min (by C-G formula based on Cr of 0.63). ------------------------------------------------------------------------------------------------------------------  Recent Labs  11/06/13 1745 11/07/13 0619  HGBA1C 9.4* 9.4*   ------------------------------------------------------------------------------------------------------------------  Recent Labs  11/07/13 0619  CHOL 165  HDL 26*  LDLCALC 64  TRIG 373*  CHOLHDL 6.3   ------------------------------------------------------------------------------------------------------------------ No results found for this basename: TSH, T4TOTAL, FREET3, T3FREE, THYROIDAB,  in the last 72 hours ------------------------------------------------------------------------------------------------------------------ No results found for this basename: VITAMINB12, FOLATE, FERRITIN, TIBC, IRON, RETICCTPCT,  in the last 72  hours  Coagulation profile No results found for this basename: INR, PROTIME,  in the last 168 hours  No results found for this basename: DDIMER,  in the last 72 hours  Cardiac Enzymes No results found for this basename: CK, CKMB, TROPONINI, MYOGLOBIN,  in the last 168 hours ------------------------------------------------------------------------------------------------------------------ No components found with this basename: POCBNP,     Selda Jalbert D.O. on 11/07/2013 at 5:55 PM  Between 7am to 7pm - Pager - [phone]  After 7pm go to [email or web address] - password [email or web address]  And look for the night coverage  person covering for me after hours  [facility] Office  [phone]

## 2013-11-07 NOTE — Progress Notes (Signed)
SLP Note  Patient Details Name: Jacob Underwood MRN: [id number] DOB: 1960-04-10   Orders received for SLE. MRI "negative for acute infarct". Will complete SLE as able.  Anvith Mauriello B. [place], [facility], [place]  Shonna Chock 11/07/2013, 3:56 PM

## 2013-11-07 NOTE — Progress Notes (Signed)
Patient transferred to bed 4N08 at 2030, alert and oriented. Patient oriented to room and equipment. Cardiac monitoring initiated. Initial assessment performed. Respirations even and unlabored, NAD noted. Will continue to monitor.

## 2013-11-07 NOTE — Care Management Note (Signed)
Page 1 of 1   11/07/2013     4:46:41 PM CARE MANAGEMENT NOTE 11/07/2013  Patient:  Bartunek,Bartley   Account Number:  [id number]  Date Initiated:  11/07/2013  Documentation initiated by:  [facility]  Subjective/Objective Assessment:   adml diplopia     Action/Plan:   discharge planning   Anticipated DC Date:  11/07/2013   Anticipated DC Plan:  [place]  CM consult      Choice offered to / List presented to:             Status of service:  Completed, signed off Medicare Important Message given?   (If response is "NO", the following Medicare IM given date fields will be blank) Date Medicare IM given:   Medicare IM given by:   Date Additional Medicare IM given:   Additional Medicare IM given by:    Discharge Disposition:  HOME/SELF CARE  Per UR Regulation:    If discussed at Long Length of Stay Meetings, dates discussed:    Comments:  11/07/13 16:30 CM received requestr from MD to arrange Outpt OT for pt and the [place].  CM  met with pt in room and gave him a Map to [facility] and placed the address and contact information on the AVS. [facility] will call pt to arrange an appointment for Outpt OT.  Pt verbalized understanding if he does not hear from [facility] by tuesday 11/10/13, he will call them to arrange appt.  CM faxed information to [facility] with request to call pt on Monday to arrange an appt.  No other CM needs were communicated.  Mariane Masters, BSN, CM [phone].

## 2013-11-07 NOTE — Discharge Instructions (Signed)
Diabetes and Exercise Exercising regularly is important. It is not just about losing weight. It has many health benefits, such as:  Improving your overall fitness, flexibility, and endurance.  Increasing your bone density.  Helping with weight control.  Decreasing your body fat.  Increasing your muscle strength.  Reducing stress and tension.  Improving your overall health. People with diabetes who exercise gain additional benefits because exercise:  Reduces appetite.  Improves the body's use of blood sugar (glucose).  Helps lower or control blood glucose.  Decreases blood pressure.  Helps control blood lipids (such as cholesterol and triglycerides).  Improves the body's use of the hormone insulin by:  Increasing the body's insulin sensitivity.  Reducing the body's insulin needs.  Decreases the risk for heart disease because exercising:  Lowers cholesterol and triglycerides levels.  Increases the levels of good cholesterol (such as high-density lipoproteins [HDL]) in the body.  Lowers blood glucose levels. YOUR ACTIVITY PLAN  Choose an activity that you enjoy and set realistic goals. Your health care provider or diabetes educator can help you make an activity plan that works for you. Exercise regularly as directed by your health care provider. This includes:  Performing resistance training twice a week such as push-ups, sit-ups, lifting weights, or using resistance bands.  Performing 150 minutes of cardio exercises each week such as walking, running, or playing sports.  Staying active and spending no more than 90 minutes at one time being inactive. Even short bursts of exercise are good for you. Three 10-minute sessions spread throughout the day are just as beneficial as a single 30-minute session. Some exercise ideas include:  Taking the dog for a walk.  Taking the stairs instead of the elevator.  Dancing to your favorite song.  Doing an exercise  video.  Doing your favorite exercise with a friend. RECOMMENDATIONS FOR EXERCISING WITH TYPE 1 OR TYPE 2 DIABETES   Check your blood glucose before exercising. If blood glucose levels are greater than 240 mg/dL, check for urine ketones. Do not exercise if ketones are present.  Avoid injecting insulin into areas of the body that are going to be exercised. For example, avoid injecting insulin into:  The arms when playing tennis.  The legs when jogging.  Keep a record of:  Food intake before and after you exercise.  Expected peak times of insulin action.  Blood glucose levels before and after you exercise.  The type and amount of exercise you have done.  Review your records with your health care provider. Your health care provider will help you to develop guidelines for adjusting food intake and insulin amounts before and after exercising.  If you take insulin or oral hypoglycemic agents, watch for signs and symptoms of hypoglycemia. They include:  Dizziness.  Shaking.  Sweating.  Chills.  Confusion.  Drink plenty of water while you exercise to prevent dehydration or heat stroke. Body water is lost during exercise and must be replaced.  Talk to your health care provider before starting an exercise program to make sure it is safe for you. Remember, almost any type of activity is better than none. Document Released: 05/05/2003 Document Revised: 06/29/2013 Document Reviewed: 07/22/2012 [facility] Patient Information 2015 [place], [place]. This information is not intended to replace advice given to you by your health care provider. Make sure you discuss any questions you have with your health care provider. Stroke Prevention Some medical conditions and behaviors are associated with an increased chance of having a stroke. You may prevent a  stroke by making healthy choices and managing medical conditions. HOW CAN I REDUCE MY RISK OF HAVING A STROKE?   Stay physically active. Get at  least 30 minutes of activity on most or all days.  Do not smoke. It may also be helpful to avoid exposure to secondhand smoke.  Limit alcohol use. Moderate alcohol use is considered to be:  No more than 2 drinks per day for men.  No more than 1 drink per day for nonpregnant women.  Eat healthy foods. This involves:  Eating 5 or more servings of fruits and vegetables a day.  Making dietary changes that address high blood pressure (hypertension), high cholesterol, diabetes, or obesity.  Manage your cholesterol levels.  Making food choices that are high in fiber and low in saturated fat, trans fat, and cholesterol may control cholesterol levels.  Take any prescribed medicines to control cholesterol as directed by your health care provider.  Manage your diabetes.  Controlling your carbohydrate and sugar intake is recommended to manage diabetes.  Take any prescribed medicines to control diabetes as directed by your health care provider.  Control your hypertension.  Making food choices that are low in salt (sodium), saturated fat, trans fat, and cholesterol is recommended to manage hypertension.  Take any prescribed medicines to control hypertension as directed by your health care provider.  Maintain a healthy weight.  Reducing calorie intake and making food choices that are low in sodium, saturated fat, trans fat, and cholesterol are recommended to manage weight.  Stop drug abuse.  Avoid taking birth control pills.  Talk to your health care provider about the risks of taking birth control pills if you are over 33 years old, smoke, get migraines, or have ever had a blood clot.  Get evaluated for sleep disorders (sleep apnea).  Talk to your health care provider about getting a sleep evaluation if you snore a lot or have excessive sleepiness.  Take medicines only as directed by your health care provider.  For some people, aspirin or blood thinners (anticoagulants) are  helpful in reducing the risk of forming abnormal blood clots that can lead to stroke. If you have the irregular heart rhythm of atrial fibrillation, you should be on a blood thinner unless there is a good reason you cannot take them.  Understand all your medicine instructions.  Make sure that other conditions (such as anemia or atherosclerosis) are addressed. SEEK IMMEDIATE MEDICAL CARE IF:   You have sudden weakness or numbness of the face, arm, or leg, especially on one side of the body.  Your face or eyelid droops to one side.  You have sudden confusion.  You have trouble speaking (aphasia) or understanding.  You have sudden trouble seeing in one or both eyes.  You have sudden trouble walking.  You have dizziness.  You have a loss of balance or coordination.  You have a sudden, severe headache with no known cause.  You have new chest pain or an irregular heartbeat. Any of these symptoms may represent a serious problem that is an emergency. Do not wait to see if the symptoms will go away. Get medical help at once. Call your local emergency services (911 in [place] Do not drive yourself to the hospital. Document Released: 03/22/2004 Document Revised: 06/29/2013 Document Reviewed: 08/15/2012 [facility] Patient Information 2015 [place], [place]. This information is not intended to replace advice given to you by your health care provider. Make sure you discuss any questions you have with your health care provider.  Ischemic Stroke A stroke (cerebrovascular accident) is the sudden death of brain tissue. It is a medical emergency. A stroke can cause permanent loss of brain function. This can cause problems with different parts of your body. A transient ischemic attack (TIA) is different because it does not cause permanent damage. A TIA is a short-lived problem of poor blood flow affecting a part of the brain. A TIA is also a serious problem because having a TIA greatly increases the chances of  having a stroke. When symptoms first develop, you cannot know if the problem might be a stroke or a TIA. CAUSES  A stroke is caused by a decrease of oxygen supply to an area of your brain. It is usually the result of a small blood clot or collection of cholesterol or fat (plaque) that blocks blood flow in the brain. A stroke can also be caused by blocked or damaged carotid arteries.  RISK FACTORS  High blood pressure (hypertension).  High cholesterol.  Diabetes mellitus.  Heart disease.  The buildup of plaque in the blood vessels (peripheral artery disease or atherosclerosis).  The buildup of plaque in the blood vessels providing blood and oxygen to the brain (carotid artery stenosis).  An abnormal heart rhythm (atrial fibrillation).  Obesity.  Smoking.  Taking oral contraceptives (especially in combination with smoking).  Physical inactivity.  A diet high in fats, salt (sodium), and calories.  Alcohol use.  Use of illegal drugs (especially cocaine and methamphetamine).  Being African American.  Being over the age of 3.  Family history of stroke.  Previous history of blood clots, stroke, TIA, or heart attack.  Sickle cell disease. SYMPTOMS  These symptoms usually develop suddenly, or may be newly present upon awakening from sleep:  Sudden weakness or numbness of the face, arm, or leg, especially on one side of the body.  Sudden trouble walking or difficulty moving arms or legs.  Sudden confusion.  Sudden personality changes.  Trouble speaking (aphasia) or understanding.  Difficulty swallowing.  Sudden trouble seeing in one or both eyes.  Double vision.  Dizziness.  Loss of balance or coordination.  Sudden severe headache with no known cause.  Trouble reading or writing. DIAGNOSIS  Your health care provider can often determine the presence or absence of a stroke based on your symptoms, history, and physical exam. Computed tomography (CT) of the  brain is usually performed to confirm the stroke, determine causes, and determine stroke severity. Other tests may be done to find the cause of the stroke. These tests may include:  Electrocardiography.  Continuous heart monitoring.  Echocardiography.  Carotid ultrasonography.  Magnetic resonance imaging (MRI).  A scan of the brain circulation.  Blood tests. PREVENTION  The risk of a stroke can be decreased by appropriately treating high blood pressure, high cholesterol, diabetes, heart disease, and obesity and by quitting smoking, limiting alcohol, and staying physically active. TREATMENT  Time is of the essence. It is important to seek treatment at the first sign of these symptoms because you may receive a medicine to dissolve the clot (thrombolytic) that cannot be given if too much time has passed since your symptoms began. Even if you do not know when your symptoms began, get treatment as soon as possible as there are other treatment options available including oxygen, intravenous (IV) fluids, and medicines to thin the blood (anticoagulants). Treatment of stroke depends on the duration, severity, and cause of your symptoms. Medicines and dietary changes may be used to address diabetes, high  blood pressure, and other risk factors. Physical, speech, and occupational therapists will assess you and work with you to improve any functions impaired by the stroke. Measures will be taken to prevent short-term and long-term complications, including infection from breathing foreign material into the lungs (aspiration pneumonia), blood clots in the legs, bedsores, and falls. Rarely, surgery may be needed to remove large blood clots or to open up blocked arteries. HOME CARE INSTRUCTIONS   Take medicines only as directed by your health care provider. Follow the directions carefully. Medicines may be used to control risk factors for a stroke. Be sure you understand all your medicine instructions.  You  may be told to take a medicine to thin the blood, such as aspirin or the anticoagulant warfarin. Warfarin needs to be taken exactly as instructed.  Too much and too little warfarin are both dangerous. Too much warfarin increases the risk of bleeding. Too little warfarin continues to allow the risk for blood clots. While taking warfarin, you will need to have regular blood tests to measure your blood clotting time. These blood tests usually include both the PT and INR tests. The PT and INR results allow your health care provider to adjust your dose of warfarin. The dose can change for many reasons. It is critically important that you take warfarin exactly as prescribed, and that you have your PT and INR levels drawn exactly as directed.  Many foods, especially foods high in vitamin K, can interfere with warfarin and affect the PT and INR results. Foods high in vitamin K include spinach, kale, broccoli, cabbage, collard and turnip greens, brussels sprouts, peas, cauliflower, seaweed, and parsley, as well as beef and pork liver, green tea, and soybean oil. You should eat a consistent amount of foods high in vitamin K. Avoid major changes in your diet, or notify your health care provider before changing your diet. Arrange a visit with a dietitian to answer your questions.  Many medicines can interfere with warfarin and affect the PT and INR results. You must tell your health care provider about any and all medicines you take. This includes all vitamins and supplements. Be especially cautious with aspirin and anti-inflammatory medicines. Do not take or discontinue any prescribed or over-the-counter medicine except on the advice of your health care provider or pharmacist.  Warfarin can have side effects, such as excessive bruising or bleeding. You will need to hold pressure over cuts for longer than usual. Your health care provider or pharmacist will discuss other potential side effects.  Avoid sports or  activities that may cause injury or bleeding.  Be mindful when shaving, flossing your teeth, or handling sharp objects.  Alcohol can change the body's ability to handle warfarin. It is best to avoid alcoholic drinks or consume only very small amounts while taking warfarin. Notify your health care provider if you change your alcohol intake.  Notify your dentist or other health care providers before procedures.  If swallow studies have determined that your swallowing reflex is present, you should eat healthy foods. Including 5 or more servings of fruits and vegetables a day may reduce the risk of stroke. Foods may need to be a certain consistency (soft or pureed), or small bites may need to be taken in order to avoid aspirating or choking. Certain dietary changes may be advised to address high blood pressure, high cholesterol, diabetes, or obesity.  Food choices that are low in sodium, saturated fat, trans fat, and cholesterol are recommended to manage high blood  pressure.  Food choies that are high in fiber, and low in saturated fat, trans fat, and cholesterol may control cholesterol levels.  Controlling carbohydrates and sugar intake is recommended to manage diabetes.  Reducing calorie intake and making food choices that are low in sodium, saturated fat, trans fat, and cholesterol are recommended to manage obesity.  Maintain a healthy weight.  Stay physically active. It is recommended that you get at least 30 minutes of activity on all or most days.  Do not use any tobacco products including cigarettes, chewing tobacco, or electronic cigarettes.  Limit alcohol use even if you are not taking warfarin. Moderate alcohol use is considered to be:  No more than 2 drinks each day for men.  No more than 1 drink each day for nonpregnant women.  Home safety. A safe home environment is important to reduce the risk of falls. Your health care provider may arrange for specialists to evaluate your  home. Having grab bars in the bedroom and bathroom is often important. Your health care provider may arrange for equipment to be used at home, such as raised toilets and a seat for the shower.  Physical, occupational, and speech therapy. Ongoing therapy may be needed to maximize your recovery after a stroke. If you have been advised to use a walker or a cane, use it at all times. Be sure to keep your therapy appointments.  Follow all instructions for follow-up with your health care provider. This is very important. This includes any referrals, physical therapy, rehabilitation, and lab tests. Proper follow-up can prevent another stroke from occurring. SEEK MEDICAL CARE IF:  You have personality changes.  You have difficulty swallowing.  You are seeing double.  You have dizziness.  You have a fever.  You have skin breakdown. SEEK IMMEDIATE MEDICAL CARE IF:  Any of these symptoms may represent a serious problem that is an emergency. Do not wait to see if the symptoms will go away. Get medical help right away. Call your local emergency services (911 in [place] Do not drive yourself to the hospital.  You have sudden weakness or numbness of the face, arm, or leg, especially on one side of the body.  You have sudden trouble walking or difficulty moving arms or legs.  You have sudden confusion.  You have trouble speaking (aphasia) or understanding.  You have sudden trouble seeing in one or both eyes.  You have a loss of balance or coordination.  You have a sudden, severe headache with no known cause.  You have new chest pain or an irregular heartbeat.  You have a partial or total loss of consciousness. Document Released: 02/12/2005 Document Revised: 06/29/2013 Document Reviewed: 09/23/2011 [facility] Patient Information 2015 [place], [place]. This information is not intended to replace advice given to you by your health care provider. Make sure you discuss any questions you have with your  health care provider.

## 2013-11-08 MED ORDER — BLOOD GLUCOSE METER KIT: PACK | Status: DC

## 2013-11-08 NOTE — Progress Notes (Signed)
STROKE TEAM PROGRESS NOTE   HISTORY Jerri Hargadon is an 53 y.o. male with a past medical history that is relevant for HTN, s/p removal right SDH years ago, brought in as due to acute onset diplopia and code stroke was called.  He said that he was doing well until about 1 pm today when he got out of his car and started having double vision that will go away after closing one eye. No associated vertigo, imbalance, difficulty swallowing, focal weakness or numbness, slurred speech, confusion, language impairment, HA, CP, SOB, or palpitations.  He called his wife on the phone around 3 pm and she said that " he sound it terrible" but he was not slurring his words.  Upon arrival to the ED he was still having diplopia but NIHSS 0 and CT brain showed no acute intracranial abnormality.  Date last known well:  Time last known well:  tPA Given: no, still with diplopia but NIHSS 0. Prior brain surgery excludes him from thrombolysis.  NIHSS: 0   SUBJECTIVE (INTERVAL HISTORY) His wife is at the bedside.  Overall he feels his condition is gradually improving. On eye patch. Stated that his diplopia is getting better. No weakness or numbness.    OBJECTIVE Temp:  [97.8 F (36.6 C)-98.4 F (36.9 C)] 98.4 F (36.9 C) (09/13 0553) Pulse Rate:  [64-83] 68 (09/13 0553) Cardiac Rhythm:  [-] Normal sinus rhythm (09/12 2002) Resp:  [18-20] 18 (09/13 0553) BP: (133-167)/(72-89) 151/89 mmHg (09/13 0553) SpO2:  [95 %-100 %] 100 % (09/13 0553)   Recent Labs Lab 11/06/13 1735  GLUCAP 145*    Recent Labs Lab 11/06/13 1723 11/06/13 2341  NA 139  --   K 3.9  --   CL 104  --   GLUCOSE 156*  --   BUN 8  --   CREATININE 0.80 0.63   No results found for this basename: AST, ALT, ALKPHOS, BILITOT, PROT, ALBUMIN,  in the last 168 hours  Recent Labs Lab 11/06/13 1723 11/06/13 1757 11/06/13 2341  WBC  --  6.5 6.5  HGB 15.6 14.0 14.5  HCT 46.0 39.5 40.4  MCV  --  89.0 86.7  PLT  --  247 254   No results  found for this basename: CKTOTAL, CKMB, CKMBINDEX, TROPONINI,  in the last 168 hours No results found for this basename: LABPROT, INR,  in the last 72 hours No results found for this basename: COLORURINE, APPERANCEUR, LABSPEC, PHURINE, GLUCOSEU, HGBUR, BILIRUBINUR, KETONESUR, PROTEINUR, UROBILINOGEN, NITRITE, LEUKOCYTESUR,  in the last 72 hours     Component Value Date/Time   CHOL 165 11/07/2013 0619   TRIG 373* 11/07/2013 0619   HDL 26* 11/07/2013 0619   CHOLHDL 6.3 11/07/2013 0619   VLDL 75* 11/07/2013 0619   LDLCALC 64 11/07/2013 0619   Lab Results  Component Value Date   HGBA1C 9.4* 11/07/2013   No results found for this basename: labopia, cocainscrnur, labbenz, amphetmu, thcu, labbarb    No results found for this basename: ETH,  in the last 168 hours  Dg Chest 2 View  11/07/2013    IMPRESSION: No acute cardiopulmonary findings.      Ct Head Wo Contrast  11/06/2013    IMPRESSION: No evidence of intracranial abnormality.     Mri and Mra Head Wo Contrast  11/07/2013  there is a small 3 mm acute infarct in the right posterior midbrain just below the aqueduct. This is consistent with the patient's blurred vision and diplopia. Negative MRA  of the head.    CUS - Bilateral: 1-39% ICA stenosis. Vertebral artery flow is antegrade.  2D echo - Left ventricle: The cavity size was normal. Systolic function was normal. The estimated ejection fraction was in the range of 55% to 60%. Although no diagnostic regional wall motion abnormality was identified, this possibility cannot be completely excluded on the basis of this study. Left ventricular diastolic function parameters were normal. - Left atrium: The atrium was mildly dilated. - Atrial septum: No defect or patent foramen ovale was identified  PHYSICAL EXAM Physical exam  Temp:  [98 F (36.7 C)-98.4 F (36.9 C)] 98.2 F (36.8 C) (09/13 1000) Pulse Rate:  [68-80] 80 (09/13 1000) Resp:  [18] 18 (09/13 1000) BP: (133-152)/(72-89)  152/77 mmHg (09/13 1000) SpO2:  [97 %-100 %] 97 % (09/13 1000)  General - Well nourished, well developed, in no apparent distress.  Ophthalmologic - Sharp disc margins OU.  Cardiovascular - Regular rate and rhythm with no murmur.  Mental Status -  Level of arousal and orientation to time, place, and person were intact. Language including expression, naming, repetition, comprehension, reading, and writing was assessed and found intact.  Cranial Nerves II - XII - II - Visual field intact OU. III, IV, VI - Extraocular movements showed right eye adduct mild impairment. V - Facial sensation intact bilaterally. VII - Facial movement intact bilaterally. VIII - Hearing & vestibular intact bilaterally. X - Palate elevates symmetrically. XI - Chin turning & shoulder shrug intact bilaterally. XII - Tongue protrusion intact.  Motor Strength - The patient's strength was normal in all extremities and pronator drift was absent.  Bulk was normal and fasciculations were absent.   Motor Tone - Muscle tone was assessed at the neck and appendages and was normal.  Reflexes - The patient's reflexes were normal in all extremities and he had no pathological reflexes.  Sensory - Light touch, temperature/pinprick, vibration and proprioception, and Romberg testing were assessed and were normal.    Coordination - The patient had normal movements in the hands and feet with no ataxia or dysmetria.  Tremor was absent.  Gait and Station - The patient's transfers, posture, gait, station, and turns were observed as normal.   ASSESSMENT/PLAN  Mr. West Boomershine is a 53 y.o. male with history of HTN, HLD, DM, SDH s/p drainage presenting with acute onset diplopia. He did not receive IV t-PA due to mild symptoms with previous SDH. Imaging confirms a right posterior brainstem infarct.  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   Activity as tolerated  Disposition:  home  Hypertension   Home meds:  none. Added lisinopril in hospital  BP 133-167. past 24h (11/08/2013 @ 6:56 AM)  SBP goal 130/80  Stable  Patient counseled to be compliant with his blood pressure medications  Diabetes  Home meds:  none  HgbA1c 9.4   Uncontrolled  Goal < 7.0  educated patient about lifestyle changes for diabetes treatment  Other Stroke Risk Factors Possible Obstructive sleep apnea, needs sleep  study to confirm   Hospital day # 2   Rosalin Hawking, MD PhD Stroke Neurology 11/08/2013 10:06 PM    To contact Stroke Continuity provider, please refer to [email or web address] After hours, contact General Neurology

## 2013-11-08 NOTE — Progress Notes (Signed)
Patient and wife given DC instuctions including handouts, both verbalize understanding an dcompliance. Patient in [facility] escorted by staff to the lobby. His wife to drive him to their residence in private vehicle.

## 2013-11-09 NOTE — Progress Notes (Signed)
UR complete.  Daisey Caloca RN, MSN 

## 2013-12-01 ENCOUNTER — Telehealth: Payer: Self-pay | Admitting: Neurology

## 2013-12-03 NOTE — Telephone Encounter (Signed)
Called pt and left message informing him that our office needed to schedule a stroke f/u with him and if he could give our office a call back.

## 2014-06-23 ENCOUNTER — Encounter: Payer: Self-pay | Admitting: Family Medicine

## 2014-06-23 ENCOUNTER — Ambulatory Visit (INDEPENDENT_AMBULATORY_CARE_PROVIDER_SITE_OTHER): Payer: BLUE CROSS/BLUE SHIELD | Admitting: Family Medicine

## 2014-06-23 VITALS — BP 118/78 | HR 80 | Ht 72.0 in | Wt 252.2 lb

## 2014-06-23 DIAGNOSIS — E119 Type 2 diabetes mellitus without complications: Secondary | ICD-10-CM

## 2014-06-23 DIAGNOSIS — M10061 Idiopathic gout, right knee: Secondary | ICD-10-CM

## 2014-06-23 DIAGNOSIS — E785 Hyperlipidemia, unspecified: Secondary | ICD-10-CM | POA: Diagnosis not present

## 2014-06-23 DIAGNOSIS — I10 Essential (primary) hypertension: Secondary | ICD-10-CM

## 2014-06-23 DIAGNOSIS — M109 Gout, unspecified: Secondary | ICD-10-CM | POA: Insufficient documentation

## 2014-06-23 MED ORDER — COLCHICINE 0.6 MG PO CAPS: 1.0000 | ORAL_CAPSULE | Freq: Two times a day (BID) | ORAL | Status: DC
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
## 2014-06-23 NOTE — Addendum Note (Signed)
Addended by: Rita Ohara on: 06/23/2014 09:27 PM   Modules accepted: Level of Service

## 2014-06-23 NOTE — Patient Instructions (Signed)
  Change taking the Metformin to twice daily (breakfast and dinner). Eventually we can consider changing to the extended release, and take it once daily with EITHER breakfast or dinner.  Take the glipizide with breakfast every morning.   Continue the allopurinol for now.  We may need to raise the dose, but I need prior records and labs first to make sure that is okay. Continue the Colchicine twice daily for prevention for now.  Eventually, if we can get the uric acid levels low using allopurinol, we may be able to change this medication to just as needed, when you have a flare, rather than to prevent one.

## 2014-06-24 ENCOUNTER — Telehealth: Payer: Self-pay | Admitting: Family Medicine

## 2014-06-24 LAB — POCT GLYCOSYLATED HEMOGLOBIN (HGB A1C): Hemoglobin A1C: 7

## 2014-06-24 MED ORDER — COLCHICINE 0.6 MG PO CAPS: 0.6000 mg | ORAL_CAPSULE | Freq: Two times a day (BID) | ORAL | Status: DC

## 2014-06-24 NOTE — Telephone Encounter (Signed)
Checked with pharmacy and this is the same. New rx sent.

## 2014-06-24 NOTE — Telephone Encounter (Signed)
I believe that mitigare is colchicine 0.6mg  (same as what was written).  Confirm that these are the same medications, and then okay to switch.

## 2014-06-24 NOTE — Telephone Encounter (Signed)
Put in Prior Auth req for Colchicine and [facility] denied advising patient needed to try Mitigare first before they would approve Colchicine.  Please advise what you would like done?

## 2014-06-25 ENCOUNTER — Other Ambulatory Visit: Payer: BLUE CROSS/BLUE SHIELD

## 2014-07-19 ENCOUNTER — Telehealth: Payer: Self-pay | Admitting: *Deleted

## 2014-07-19 ENCOUNTER — Encounter: Payer: BLUE CROSS/BLUE SHIELD | Admitting: Family Medicine

## 2014-07-19 NOTE — Telephone Encounter (Signed)
This patient no showed for their appointment today.Which of the following is necessary for this patient.   A) No follow-up necessary   B) Follow-up urgent. Locate Patient Immediately.   C) Follow-up necessary. Contact patient and Schedule visit in ____ Days.   D) Follow-up Advised. Contact patient and Schedule visit in ____ Days. 

## 2014-07-19 NOTE — Telephone Encounter (Signed)
He did not come for his fasting labs (no showed lab visit last month).  He is scheduled for physical in October. He needs to be sent a no show letter, with our policy. He needs to reschedule at his convenience, and to have fasting labs done prior to his visit.  I will not be refilling any of his medications without a visit.  If he doesn't intend to follow up here, then he needs to cancel his physical scheduled for October.  (someone can either call him with this info, or put it all in a letter).

## 2014-07-21 ENCOUNTER — Telehealth: Payer: Self-pay | Admitting: *Deleted

## 2014-07-21 NOTE — Telephone Encounter (Signed)
Left message for patient to please call back and schedule med check that was no showed, as well as a fasting lab visit prior to that appt.

## 2014-07-23 ENCOUNTER — Encounter: Payer: Self-pay | Admitting: Family Medicine

## 2014-07-27 ENCOUNTER — Encounter: Payer: Self-pay | Admitting: Family Medicine

## 2014-12-13 ENCOUNTER — Encounter: Payer: BLUE CROSS/BLUE SHIELD | Admitting: Family Medicine

## 2014-12-13 DIAGNOSIS — Z0000 Encounter for general adult medical examination without abnormal findings: Secondary | ICD-10-CM

## 2014-12-17 ENCOUNTER — Encounter: Payer: Self-pay | Admitting: Family Medicine

## 2016-02-28 IMAGING — MR MR HEAD W/O CM
9 of 11 series · 31 of 48 positions shown · non-contrast
Comparison: CT head 11/06/2013

ADDENDUM:
After further review of the images, there is a small 3 mm acute
infarct in the right posterior midbrain just below the aqueduct.
This is consistent with the patient's blurred vision and diplopia.

These results were called by telephone at the time of interpretation
on 11/07/2013 at [DATE] to Dr. Tjalma , who verbally acknowledged
these results.
CLINICAL DATA: Stroke.  Blurred vision.
EXAM:
MRI HEAD WITHOUT CONTRAST
MRA HEAD WITHOUT CONTRAST
TECHNIQUE: Multiplanar, multiecho pulse sequences of the brain and surrounding
structures were obtained without intravenous contrast. Angiographic
images of the head were obtained using MRA technique without
contrast.

[Series 3: DWI · axial · 5.0mm · 1.09mm/px · z∈[-36,+108]mm · 5 of 60 slices shown (1 of 4)]
[im 1/60]
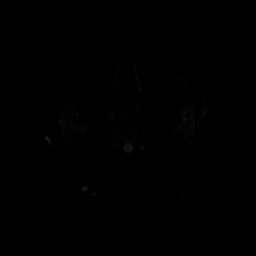
[im 15/60]
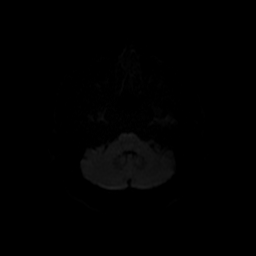
[im 30/60]
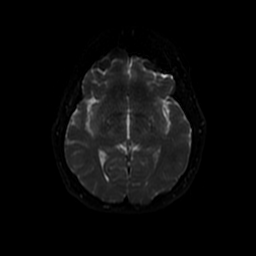
[im 45/60]
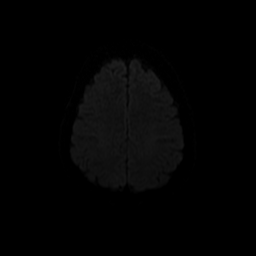
[im 60/60]
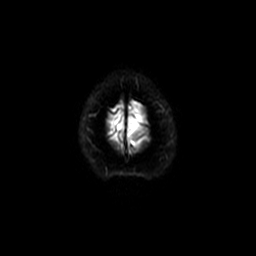

[Series 4: T1 · sagittal · 5.0mm · 0.47mm/px · 2 of 22 slices shown]
[im 1/22]
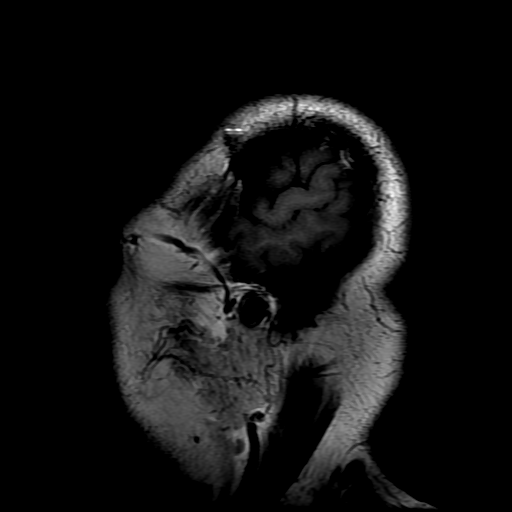
[im 22/22]
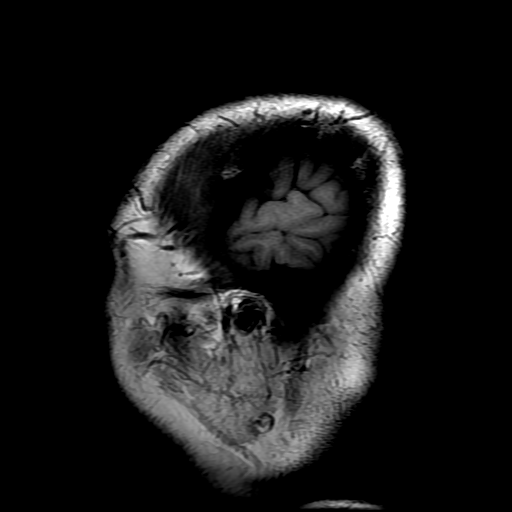

[Series 5: DWI · coronal · 3.0mm · 1.09mm/px · 6 of 64 slices shown (2 of 4)]
[im 1/64]
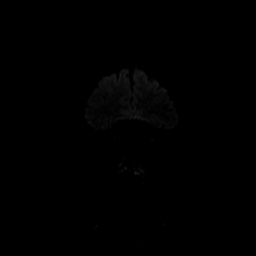
[im 13/64]
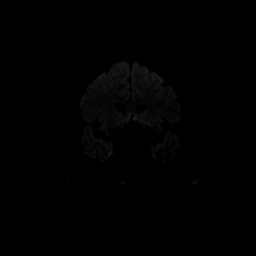
[im 26/64]
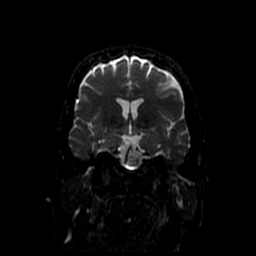
[im 38/64]
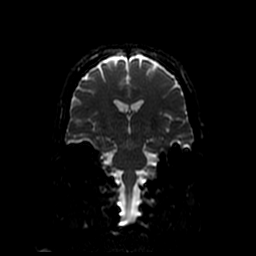
[im 51/64]
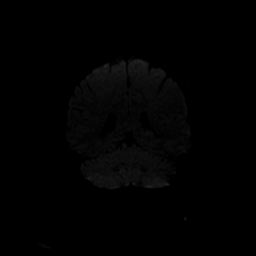
[im 64/64]
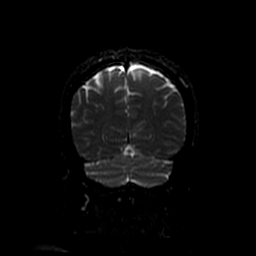

[Series 6: T2 · axial · 5.0mm · 0.43mm/px · z∈[-39,+97]mm · 2 of 24 slices shown (1 of 2)]
[im 1/24]
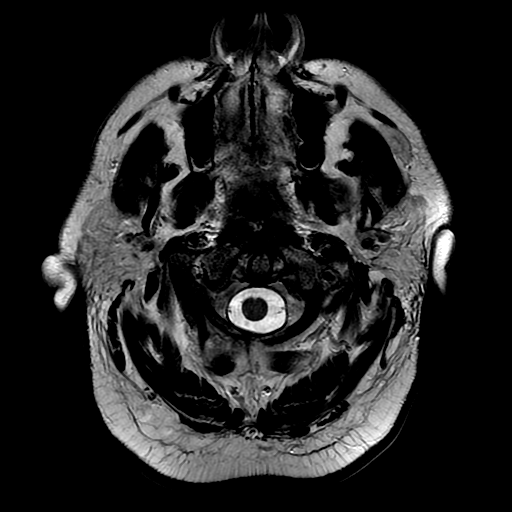
[im 24/24]
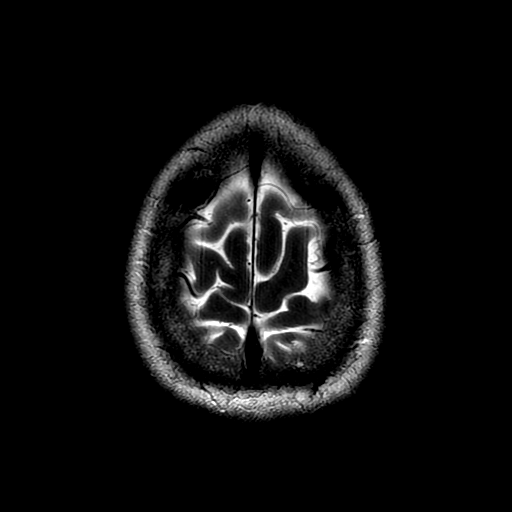

[Series 7: (id) mt fs · axial · 1.4mm · 0.43mm/px · z∈[-56,+15]mm · 6 of 154 slices shown]
[im 1/154]
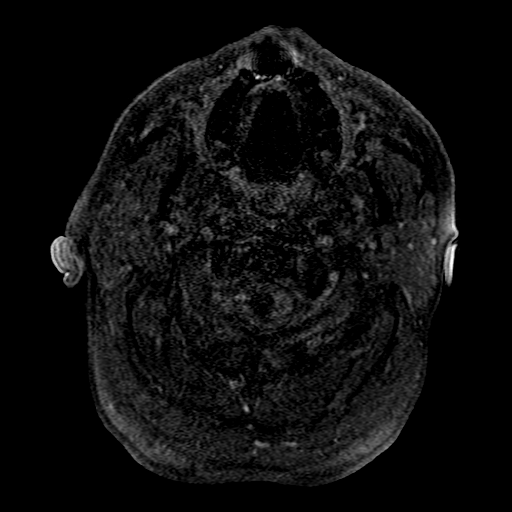
[im 26/154]
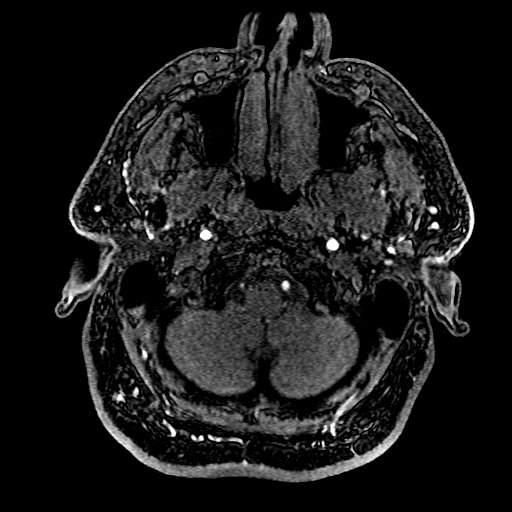
[im 52/154]
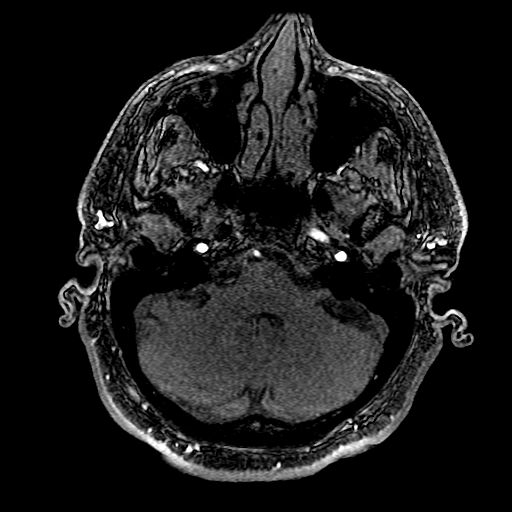
[im 64/154]
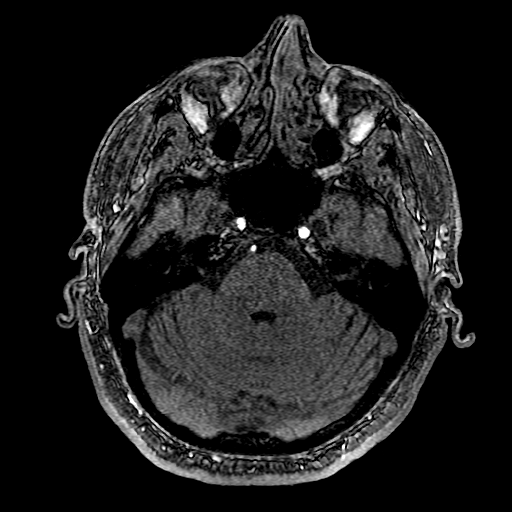
[im 90/154]
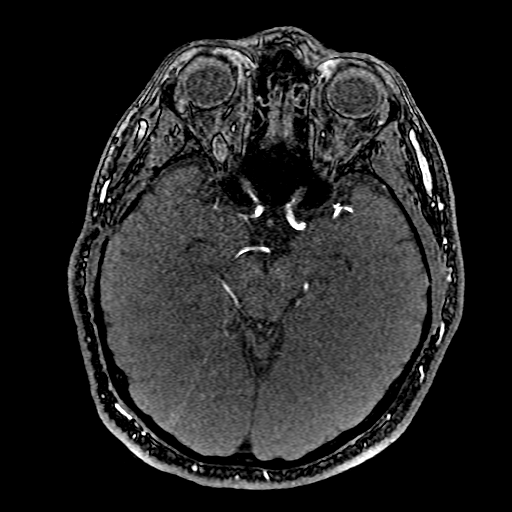
[im 103/154]
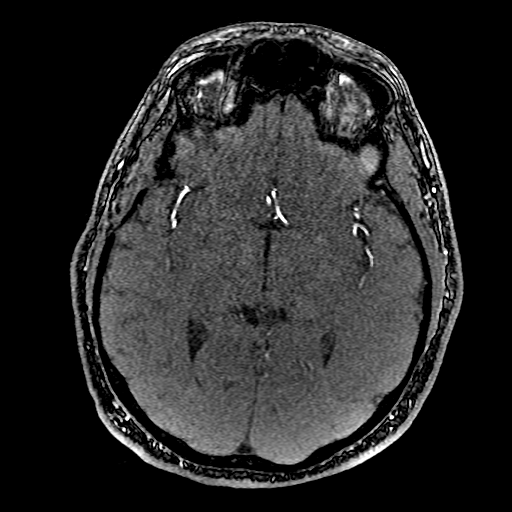

[Series 8: FLAIR · axial · 5.0mm · 0.43mm/px · z∈[-39,+97]mm · 2 of 24 slices shown]
[im 1/24]
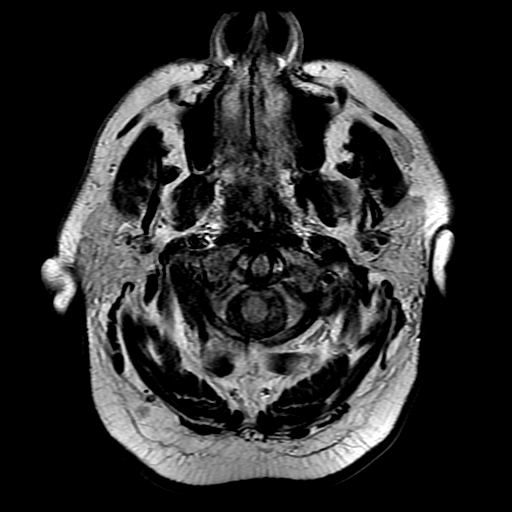
[im 24/24]
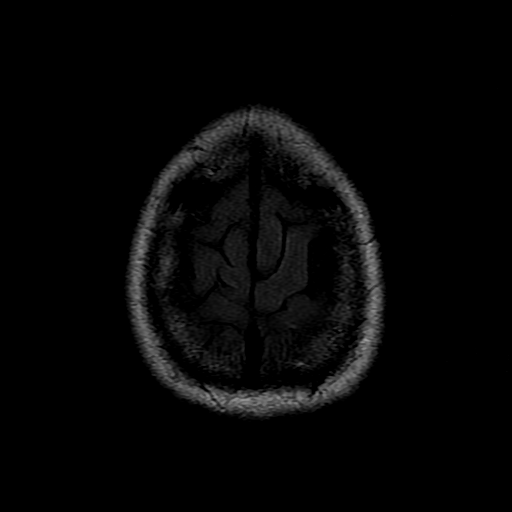

[Series 11: T2 · coronal · 5.0mm · 0.43mm/px · 2 of 28 slices shown (2 of 2)]
[im 1/28]
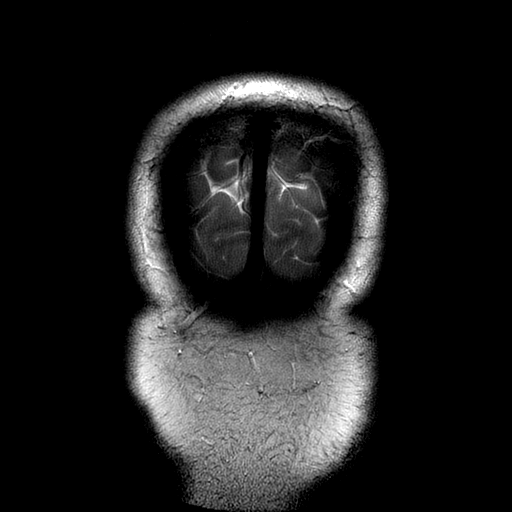
[im 28/28]
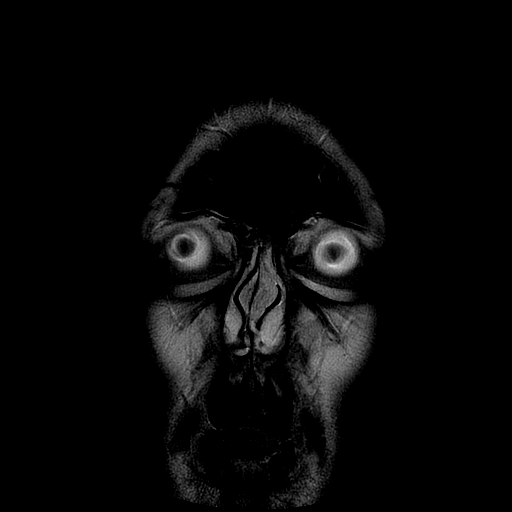

[Series 300: DWI · axial · 5.0mm · 1.09mm/px · z∈[-36,+108]mm · 3 of 30 slices shown (3 of 4)]
[im 1/30]
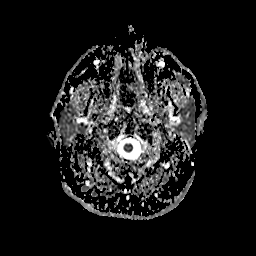
[im 15/30]
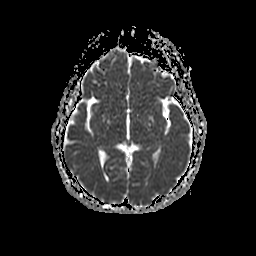
[im 30/30]
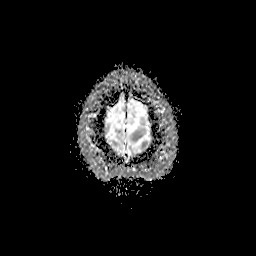

[Series 500: DWI · coronal · 3.0mm · 1.09mm/px · 3 of 32 slices shown (4 of 4)]
[im 1/32]
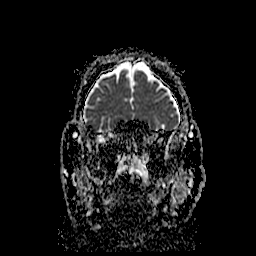
[im 16/32]
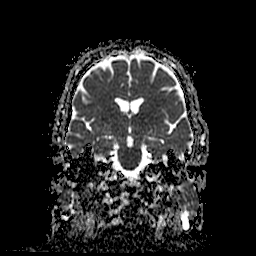
[im 32/32]
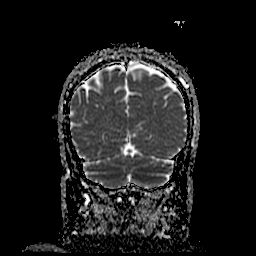

[31 of 48 positions shown; findings below may reference images not displayed]

FINDINGS: MRI HEAD FINDINGS

Negative for acute infarct

Negative for chronic ischemia

Negative for demyelinating disease.

Negative for hemorrhage or fluid collection. Negative for mass or
edema.

Pituitary normal in size.  Cranial cervical junction normal

Paranasal sinuses are clear.

MRA HEAD FINDINGS

Left vertebral artery widely patent to the basilar. Left PICA
patent. Right vertebral artery is hypoplastic and ends in PICA.
Basilar widely patent. Fetal origin left posterior cerebral artery
with hypoplastic left P1 segment. Both posterior cerebral arteries
are patent without stenosis

Internal carotid artery patent bilaterally. Anterior and middle
cerebral arteries are patent without significant stenosis.

Negative for cerebral aneurysm.
IMPRESSION: Negative for acute infarct.  Negative MRI of the head

Negative MRA of the head.

## 2016-03-03 ENCOUNTER — Emergency Department (HOSPITAL_COMMUNITY): Payer: BLUE CROSS/BLUE SHIELD

## 2016-03-03 ENCOUNTER — Encounter (HOSPITAL_COMMUNITY): Payer: Self-pay | Admitting: *Deleted

## 2016-03-03 ENCOUNTER — Observation Stay (HOSPITAL_COMMUNITY)
Admission: EM | Admit: 2016-03-03 | Discharge: 2016-03-04 | Disposition: A | Discharge status: Home / Self Care | Payer: BLUE CROSS/BLUE SHIELD | Attending: Internal Medicine | Admitting: Internal Medicine

## 2016-03-03 ENCOUNTER — Ambulatory Visit (HOSPITAL_COMMUNITY): Admission: EM | Admit: 2016-03-03 | Discharge: 2016-03-03 | Discharge status: Absent without leave | Payer: Self-pay

## 2016-03-03 DIAGNOSIS — Z87898 Personal history of other specified conditions: Secondary | ICD-10-CM

## 2016-03-03 DIAGNOSIS — Z7982 Long term (current) use of aspirin: Secondary | ICD-10-CM | POA: Insufficient documentation

## 2016-03-03 DIAGNOSIS — R739 Hyperglycemia, unspecified: Secondary | ICD-10-CM

## 2016-03-03 DIAGNOSIS — R55 Syncope and collapse: Secondary | ICD-10-CM | POA: Insufficient documentation

## 2016-03-03 DIAGNOSIS — R413 Other amnesia: Secondary | ICD-10-CM

## 2016-03-03 DIAGNOSIS — E1169 Type 2 diabetes mellitus with other specified complication: Secondary | ICD-10-CM | POA: Diagnosis not present

## 2016-03-03 DIAGNOSIS — Z8673 Personal history of transient ischemic attack (TIA), and cerebral infarction without residual deficits: Secondary | ICD-10-CM

## 2016-03-03 DIAGNOSIS — I10 Essential (primary) hypertension: Secondary | ICD-10-CM | POA: Diagnosis not present

## 2016-03-03 DIAGNOSIS — Z85028 Personal history of other malignant neoplasm of stomach: Secondary | ICD-10-CM | POA: Insufficient documentation

## 2016-03-03 DIAGNOSIS — E1165 Type 2 diabetes mellitus with hyperglycemia: Secondary | ICD-10-CM | POA: Diagnosis not present

## 2016-03-03 DIAGNOSIS — R9431 Abnormal electrocardiogram [ECG] [EKG]: Secondary | ICD-10-CM | POA: Insufficient documentation

## 2016-03-03 DIAGNOSIS — Z7984 Long term (current) use of oral hypoglycemic drugs: Secondary | ICD-10-CM | POA: Diagnosis not present

## 2016-03-03 DIAGNOSIS — R079 Chest pain, unspecified: Principal | ICD-10-CM | POA: Insufficient documentation

## 2016-03-03 DIAGNOSIS — E119 Type 2 diabetes mellitus without complications: Secondary | ICD-10-CM | POA: Diagnosis present

## 2016-03-03 DIAGNOSIS — Z79899 Other long term (current) drug therapy: Secondary | ICD-10-CM | POA: Diagnosis not present

## 2016-03-03 DIAGNOSIS — E669 Obesity, unspecified: Secondary | ICD-10-CM

## 2016-03-03 HISTORY — DX: Malignant neoplasm of stomach, unspecified: C16.9

## 2016-03-03 HISTORY — DX: Nontraumatic intracranial hemorrhage, unspecified: I62.9

## 2016-03-03 LAB — URINALYSIS, ROUTINE W REFLEX MICROSCOPIC
BILIRUBIN URINE: NEGATIVE
Glucose, UA: >=500 mg/dL — AB
Hgb urine dipstick: NEGATIVE
KETONES UR: NEGATIVE mg/dL
LEUKOCYTES UA: NEGATIVE
NITRITE: NEGATIVE
PH: 5 (ref 5.0–8.0)
Protein, ur: NEGATIVE mg/dL
Specific Gravity, Urine: 1.025 (ref 1.005–1.030)

## 2016-03-03 LAB — BASIC METABOLIC PANEL
ANION GAP: 11 (ref 5–15)
BUN: 15 mg/dL (ref 6–20)
CALCIUM: 9.3 mg/dL (ref 8.9–10.3)
CO2: 19 mmol/L — AB (ref 22–32)
Chloride: 104 mmol/L (ref 101–111)
Creatinine, Ser: 0.76 mg/dL (ref 0.61–1.24)
GFR calc non Af Amer: >60 mL/min (ref >60)
Glucose, Bld: 327 mg/dL — ABNORMAL HIGH (ref 65–99)
Potassium: 3.9 mmol/L (ref 3.5–5.1)
SODIUM: 134 mmol/L — AB (ref 135–145)

## 2016-03-03 LAB — CBC
HCT: 45.6 % (ref 39.0–52.0)
Hemoglobin: 16.3 g/dL (ref 13.0–17.0)
MCH: 31 pg (ref 26.0–34.0)
MCHC: 35.7 g/dL (ref 30.0–36.0)
MCV: 86.9 fL (ref 78.0–100.0)
Platelets: 249 10*3/uL (ref 150–400)
RBC: 5.25 MIL/uL (ref 4.22–5.81)
RDW: 12.9 % (ref 11.5–15.5)
WBC: 8.6 10*3/uL (ref 4.0–10.5)

## 2016-03-03 LAB — URINALYSIS, MICROSCOPIC (REFLEX)
RBC / HPF: NONE SEEN RBC/hpf (ref 0–5)
WBC, UA: NONE SEEN WBC/hpf (ref 0–5)

## 2016-03-03 LAB — I-STAT TROPONIN, ED: TROPONIN I, POC: 0 ng/mL (ref 0.00–0.08)

## 2016-03-03 LAB — CBG MONITORING, ED: Glucose-Capillary: 341 mg/dL — ABNORMAL HIGH (ref 65–99)

## 2016-03-03 LAB — MAGNESIUM: Magnesium: 1.8 mg/dL (ref 1.7–2.4)

## 2016-03-03 LAB — TROPONIN I

## 2016-03-03 MED ORDER — SODIUM CHLORIDE 0.9 % IV SOLN
INTRAVENOUS | Status: DC
  Administered 2016-03-04 (×2): via INTRAVENOUS

## 2016-03-03 MED ORDER — INSULIN ASPART 100 UNIT/ML ~~LOC~~ SOLN
0.0000 [IU] | SUBCUTANEOUS | Status: DC
  Administered 2016-03-04: 5 [IU] via SUBCUTANEOUS
  Administered 2016-03-04 (×2): 3 [IU] via SUBCUTANEOUS

## 2016-03-03 MED ORDER — ONDANSETRON HCL 4 MG/2ML IJ SOLN: 4.0000 mg | Freq: Four times a day (QID) | INTRAMUSCULAR | Status: DC | PRN

## 2016-03-03 MED ORDER — ASPIRIN EC 81 MG PO TBEC
162.0000 mg | DELAYED_RELEASE_TABLET | Freq: Every day | ORAL | Status: DC
  Administered 2016-03-04: 162 mg via ORAL
  Filled 2016-03-03: qty 2

## 2016-03-03 MED ORDER — ENOXAPARIN SODIUM 40 MG/0.4ML ~~LOC~~ SOLN
40.0000 mg | Freq: Every day | SUBCUTANEOUS | Status: DC
  Administered 2016-03-04: 40 mg via SUBCUTANEOUS
  Filled 2016-03-03: qty 0.4

## 2016-03-03 MED ORDER — MORPHINE SULFATE (PF) 4 MG/ML IV SOLN: 2.0000 mg | INTRAVENOUS | Status: DC | PRN

## 2016-03-03 MED ORDER — LISINOPRIL 10 MG PO TABS
10.0000 mg | ORAL_TABLET | Freq: Every day | ORAL | Status: DC
  Administered 2016-03-04: 10 mg via ORAL
  Filled 2016-03-03: qty 1

## 2016-03-03 MED ORDER — SODIUM CHLORIDE 0.9 % IV BOLUS (SEPSIS)
1000.0000 mL | Freq: Once | INTRAVENOUS | Status: AC
  Administered 2016-03-03: 1000 mL via INTRAVENOUS

## 2016-03-03 MED ORDER — INSULIN ASPART 100 UNIT/ML ~~LOC~~ SOLN
6.0000 [IU] | Freq: Once | SUBCUTANEOUS | Status: AC
Start: 2016-03-03 — End: 2016-03-03
  Administered 2016-03-03: 6 [IU] via INTRAVENOUS
  Filled 2016-03-03: qty 1

## 2016-03-03 MED ORDER — ACETAMINOPHEN 325 MG PO TABS: 650.0000 mg | ORAL_TABLET | ORAL | Status: DC | PRN
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
## 2016-03-03 NOTE — ED Notes (Signed)
[place] hospitalist at bedside

## 2016-03-03 NOTE — ED Notes (Signed)
Pt reports recurring near syncopal episode for the past 2 months, states it has been occurring more often.  Denies any cp or SOB or dizziness at this time.  Pt is A&Ox 4.  Pt reports he has hx of DM and seizure and has not been taking his meds for a long time.

## 2016-03-03 NOTE — ED Triage Notes (Signed)
Pt states 15 min episodes of near-syncope and L chest spasms x 2 months that are becoming more frequent.  CBG in 300's.  Denies nausea or sob.
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
## 2016-03-04 ENCOUNTER — Encounter (HOSPITAL_COMMUNITY): Payer: Self-pay | Admitting: Physician Assistant

## 2016-03-04 ENCOUNTER — Observation Stay (HOSPITAL_BASED_OUTPATIENT_CLINIC_OR_DEPARTMENT_OTHER): Payer: BLUE CROSS/BLUE SHIELD

## 2016-03-04 DIAGNOSIS — R9431 Abnormal electrocardiogram [ECG] [EKG]: Secondary | ICD-10-CM | POA: Diagnosis not present

## 2016-03-04 DIAGNOSIS — I10 Essential (primary) hypertension: Secondary | ICD-10-CM | POA: Diagnosis not present

## 2016-03-04 DIAGNOSIS — E1165 Type 2 diabetes mellitus with hyperglycemia: Secondary | ICD-10-CM | POA: Diagnosis not present

## 2016-03-04 DIAGNOSIS — E1169 Type 2 diabetes mellitus with other specified complication: Secondary | ICD-10-CM | POA: Diagnosis not present

## 2016-03-04 DIAGNOSIS — Z8673 Personal history of transient ischemic attack (TIA), and cerebral infarction without residual deficits: Secondary | ICD-10-CM | POA: Diagnosis not present

## 2016-03-04 DIAGNOSIS — E669 Obesity, unspecified: Secondary | ICD-10-CM

## 2016-03-04 DIAGNOSIS — R079 Chest pain, unspecified: Secondary | ICD-10-CM | POA: Diagnosis not present

## 2016-03-04 DIAGNOSIS — R0789 Other chest pain: Secondary | ICD-10-CM | POA: Diagnosis not present

## 2016-03-04 DIAGNOSIS — R413 Other amnesia: Secondary | ICD-10-CM

## 2016-03-04 DIAGNOSIS — Z87898 Personal history of other specified conditions: Secondary | ICD-10-CM

## 2016-03-04 LAB — BASIC METABOLIC PANEL
Anion gap: 6 (ref 5–15)
BUN: 12 mg/dL (ref 6–20)
CHLORIDE: 109 mmol/L (ref 101–111)
CO2: 24 mmol/L (ref 22–32)
CREATININE: 0.8 mg/dL (ref 0.61–1.24)
Calcium: 8.5 mg/dL — ABNORMAL LOW (ref 8.9–10.3)
GFR calc Af Amer: >60 mL/min (ref >60)
GFR calc non Af Amer: >60 mL/min (ref >60)
GLUCOSE: 262 mg/dL — AB (ref 65–99)
Potassium: 4 mmol/L (ref 3.5–5.1)
SODIUM: 139 mmol/L (ref 135–145)

## 2016-03-04 LAB — LIPID PANEL
CHOLESTEROL: 176 mg/dL (ref 0–200)
HDL: 29 mg/dL — ABNORMAL LOW (ref >40)
LDL Cholesterol: 118 mg/dL — ABNORMAL HIGH (ref 0–99)
Total CHOL/HDL Ratio: 6.1 RATIO
Triglycerides: 143 mg/dL (ref <150)
VLDL: 29 mg/dL (ref 0–40)

## 2016-03-04 LAB — CBC WITH DIFFERENTIAL/PLATELET
Basophils Absolute: 0 10*3/uL (ref 0.0–0.1)
Basophils Relative: 1 %
EOS ABS: 0.3 10*3/uL (ref 0.0–0.7)
EOS PCT: 4 %
HCT: 39.7 % (ref 39.0–52.0)
HEMOGLOBIN: 13.9 g/dL (ref 13.0–17.0)
LYMPHS ABS: 2.4 10*3/uL (ref 0.7–4.0)
Lymphocytes Relative: 38 %
MCH: 30.4 pg (ref 26.0–34.0)
MCHC: 35 g/dL (ref 30.0–36.0)
MCV: 86.9 fL (ref 78.0–100.0)
MONOS PCT: 11 %
Monocytes Absolute: 0.7 10*3/uL (ref 0.1–1.0)
Neutro Abs: 3 10*3/uL (ref 1.7–7.7)
Neutrophils Relative %: 46 %
PLATELETS: 225 10*3/uL (ref 150–400)
RBC: 4.57 MIL/uL (ref 4.22–5.81)
RDW: 12.7 % (ref 11.5–15.5)
WBC: 6.3 10*3/uL (ref 4.0–10.5)

## 2016-03-04 LAB — GLUCOSE, CAPILLARY
GLUCOSE-CAPILLARY: 235 mg/dL — AB (ref 65–99)
GLUCOSE-CAPILLARY: 244 mg/dL — AB (ref 65–99)
Glucose-Capillary: 263 mg/dL — ABNORMAL HIGH (ref 65–99)

## 2016-03-04 LAB — ECHOCARDIOGRAM COMPLETE
HEIGHTINCHES: 72 in
WEIGHTICAEL: 3958.4 [oz_av]

## 2016-03-04 LAB — TROPONIN I
Troponin I: <0.03 ng/mL (ref <0.03)
Troponin I: <0.03 ng/mL (ref <0.03)

## 2016-03-04 MED ORDER — INSULIN ASPART 100 UNIT/ML ~~LOC~~ SOLN
0.0000 [IU] | Freq: Three times a day (TID) | SUBCUTANEOUS | Status: DC
  Administered 2016-03-04: 8 [IU] via SUBCUTANEOUS

## 2016-03-04 MED ORDER — METFORMIN HCL 1000 MG PO TABS: 1000.0000 mg | ORAL_TABLET | Freq: Two times a day (BID) | ORAL | 4 refills | Status: AC

## 2016-03-04 MED ORDER — LISINOPRIL 10 MG PO TABS: 10.0000 mg | ORAL_TABLET | Freq: Every day | ORAL | 4 refills | Status: AC

## 2016-03-04 MED ORDER — ASPIRIN EC 81 MG PO TBEC: 81.0000 mg | DELAYED_RELEASE_TABLET | Freq: Every day | ORAL | 3 refills | Status: AC

## 2016-03-04 MED ORDER — INSULIN ASPART 100 UNIT/ML ~~LOC~~ SOLN: 0.0000 [IU] | Freq: Every day | SUBCUTANEOUS | Status: DC

## 2016-03-04 MED ORDER — INSULIN DETEMIR 100 UNIT/ML ~~LOC~~ SOLN
5.0000 [IU] | Freq: Every day | SUBCUTANEOUS | Status: DC
  Administered 2016-03-04: 5 [IU] via SUBCUTANEOUS
  Filled 2016-03-04 (×2): qty 0.05

## 2016-03-04 MED ORDER — PRAVASTATIN SODIUM 20 MG PO TABS: 20.0000 mg | ORAL_TABLET | Freq: Every day | ORAL | Status: DC

## 2016-03-04 MED ORDER — GLIMEPIRIDE 2 MG PO TABS: 2.0000 mg | ORAL_TABLET | Freq: Every day | ORAL | 4 refills | Status: DC

## 2016-03-04 MED ORDER — LOVASTATIN 20 MG PO TABS: 20.0000 mg | ORAL_TABLET | Freq: Every day | ORAL | 4 refills | Status: DC

## 2016-03-04 MED ORDER — ASPIRIN EC 81 MG PO TBEC: 81.0000 mg | DELAYED_RELEASE_TABLET | Freq: Every day | ORAL | 4 refills | Status: DC

## 2016-03-04 NOTE — Progress Notes (Signed)
Echocardiogram 2D Echocardiogram has been performed.  Aggie Cosier 03/04/2016, 2:44 PM

## 2016-03-04 NOTE — Discharge Summary (Signed)
Physician Discharge Summary   Patient ID: Jacob Underwood MRN: [id number] DOB/AGE: [birth year] 56 y.o.  Admit date: 03/03/2016 Discharge date: 03/04/2016  Primary Care Physician:  Vikki Ports, MD  Discharge Diagnoses:    . Atypical Chest pain . Essential hypertension . Diabetes mellitus type 2 in obese ([place])   Hyperlipidemia   History of CVA   Consults:  Cardiology  Recommendations for Outpatient Follow-up:  1. Please repeat CBC/BMET at next visit   DIET: Carb modified    Allergies:   Allergies  Allergen Reactions  . Dilantin [Phenytoin Sodium Extended] Rash     DISCHARGE MEDICATIONS: Current Discharge Medication List    START taking these medications   Details  glimepiride (AMARYL) 2 MG tablet Take 1 tablet (2 mg total) by mouth daily with breakfast. Qty: 30 tablet, Refills: 4    lovastatin (MEVACOR) 20 MG tablet Take 1 tablet (20 mg total) by mouth at bedtime. Qty: 30 tablet, Refills: 4      CONTINUE these medications which have CHANGED   Details  aspirin EC 81 MG tablet Take 1 tablet (81 mg total) by mouth daily. Over the counter Qty: 30 tablet, Refills: 3    lisinopril (PRINIVIL,ZESTRIL) 10 MG tablet Take 1 tablet (10 mg total) by mouth daily. Qty: 30 tablet, Refills: 4    metFORMIN (GLUCOPHAGE) 1000 MG tablet Take 1 tablet (1,000 mg total) by mouth 2 (two) times daily with a meal. Qty: 60 tablet, Refills: 4      CONTINUE these medications which have NOT CHANGED   Details  CINNAMON PO Take 2 capsules by mouth daily.      STOP taking these medications     Colchicine (MITIGARE) 0.6 MG CAPS          Brief H and P: For complete details please refer to admission H and P, but in brief Patient is a 56 year old male with HTN, DM type II, CVA in 2015, and gout; who presents with complaints of spasms in his chest. Patient reported that his symptoms started couple weeks ago and recently increased in frequency.Spasms occur on the left side, do not radiate, and  he notes that he feels as though his chest is tightening up. Symptoms last approximately 1-2 minutes and then usually self resolved. Nothing that he can specifically pick out has provoked,aggravated, and/or relievedsymptoms. He admits that symptoms occur at night and have broken them up out of his sleep. He expresses multiple social stressors. Associated symptoms include generalized fatigue, lightheadedness, headache, frequency of urination, polydipsia, increased thirst. Denies having fever, chills, shortness of breath, palpitations, weight change, lower extremity swelling, recent travel, focal weakness. Reports passing out for a few minutes approximately 2 months ago, but was not evaluated for this. Back in 2015 the patient was diagnosed with a stroke which he had loss of part of his vision that lasted 3 months but he notes that his sinusitis back to normal. During this time was when he was diagnosed with hypertension and diabetes but he has not been on those medications in some time due to insurance and financial issues.  Hospital Course:  Atypical Chest pain - Reported intermittent episodes of chest spasm. EKG changes in the inferolateral leads. - Troponins negative, cardiology consulted, No cardiac workup needed, likely noncardiac - 2D echo showed EF 50-55%, possible anterolatero-lateral hypokinesis. Patient was started on ASA, ACEI and statin. Outpatient follow-up with cardiology. Secondary risk  prevention recommended.      Diabetes mellitus type 2 in obese [facility]) with hyperglycemia:  CBG 341 on admission - Noncompliant, has not been taking any medications due to financial reasons - Started on sliding scale insulin while inpatient - Discussed in detail with the patient about [employer] $4 list, placed on Amaryl 2 mg daily and metformin 1000 mg twice a day upon discharge    Essential hypertension - Noncompliant has not been taking medications due to financial reason - Restarted on lisinopril     History of CVA (cerebrovascular accident) in 2015, felt thrombotic due to risk factors - Should be on aspirin and statin - Lipid panel showed LDL of 118    History of syncope several months ago with Memory loss - Strongly recommended to follow up with PCP and outpatient workup - Patient has not been following with any physician  or taking any of his medications.     Day of Discharge BP 129/79   Pulse 85   Temp 97.5 F (36.4 C) (Oral)   Resp 20   Ht 6' (1.829 m)   Wt 112.2 kg (247 lb 6.4 oz)   SpO2 95%   BMI 33.55 kg/m   Physical Exam: General: Alert and awake oriented x3 not in any acute distress. HEENT: anicteric sclera, pupils reactive to light and accommodation CVS: S1-S2 clear no murmur rubs or gallops Chest: clear to auscultation bilaterally, no wheezing rales or rhonchi Abdomen: soft nontender, nondistended, normal bowel sounds Extremities: no cyanosis, clubbing or edema noted bilaterally Neuro: Cranial nerves II-XII intact, no focal neurological deficits   The results of significant diagnostics from this hospitalization (including imaging, microbiology, ancillary and laboratory) are listed below for reference.    LAB RESULTS: Basic Metabolic Panel:  Recent Labs Lab 03/03/16 1545 03/03/16 2238 03/04/16 0348  NA 134*  --  139  K 3.9  --  4.0  CL 104  --  109  CO2 19*  --  24  GLUCOSE 327*  --  262*  BUN 15  --  12  CREATININE 0.76  --  0.80  CALCIUM 9.3  --  8.5*  MG  --  1.8  --    Liver Function Tests: No results for input(s): AST, ALT, ALKPHOS, BILITOT, PROT, ALBUMIN in the last 168 hours. No results for input(s): LIPASE, AMYLASE in the last 168 hours. No results for input(s): AMMONIA in the last 168 hours. CBC:  Recent Labs Lab 03/03/16 1545 03/04/16 0348  WBC 8.6 6.3  NEUTROABS  --  3.0  HGB 16.3 13.9  HCT 45.6 39.7  MCV 86.9 86.9  PLT 249 225   Cardiac Enzymes:  Recent Labs Lab 03/04/16 0046 03/04/16 0348  TROPONINI <0.03 <0.03    BNP: Invalid input(s): POCBNP CBG:  Recent Labs Lab 03/04/16 0736 03/04/16 1122  GLUCAP 244* 263*    Significant Diagnostic Studies:  Dg Chest 2 View  Result Date: 03/03/2016 CLINICAL DATA:  56 year old presenting with intermittent syncope over the past 6 months, presenting with spasmodic chest pain. Current history of diabetes, hypertension, gout. Personal history of stroke and gastric cancer. EXAM: CHEST  2 VIEW COMPARISON:  11/06/2013. FINDINGS: AP erect and lateral images were obtained. Suboptimal inspiration accounts for atelectasis in the left lower lobe. Lungs otherwise clear. No confluent airspace consolidation. No pleural effusions. Bronchovascular markings normal. Pulmonary vascularity normal. Cardiac silhouette upper normal in size, unchanged. Hilar and mediastinal contours unremarkable and unchanged. Visualized bony thorax intact. IMPRESSION: Suboptimal inspiration accounts for mild left lower lobe atelectasis. No acute cardiopulmonary disease otherwise. Electronically Signed   By: Evangeline Dakin  M.D.   On: 03/03/2016 16:39    2D ECHO: Study Conclusions  - Left ventricle: The cavity size was mildly dilated. Systolic   function was normal. The estimated ejection fraction was in the   range of 50% to 55%. Possible hypokinesis of the anteroseptal   myocardium. - Aortic valve: Trileaflet; normal thickness, mildly calcified   leaflets. - Mitral valve: Calcified annulus. There was trivial regurgitation.   Disposition and Follow-up: Discharge Instructions    Diet Carb Modified    Complete by:  As directed    Increase activity slowly    Complete by:  As directed        DISPOSITION: home    [place]. Call on 03/05/2016.   Why:  for follow up appoinment  Contact information: [place] [phone]       Cristopher Peru, MD. Schedule an  appointment as soon as possible for a visit in 2 week(s).   Specialty:  Cardiology Contact information: [id number] [place] [address] [place] [address] [phone]            Time spent on Discharge: 39mins   Signed:   Kylia Grajales M.D. [facility] 03/04/2016, 4:10 PM Pager: [phone]

## 2016-03-04 NOTE — Progress Notes (Signed)
Both the pt & his significant other were given educational information at discharge. Pt  Was given information on follow-up appointments as well as medication administration. Pt given paper prescriptions. Pt's IV & telemetry d/c'd. CCMD notified of pt being taken off of monitor for discharge. All questions were answered. Pt took all of his belongings with him home. Hoover Brunette, RN
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
## 2016-03-04 NOTE — Progress Notes (Signed)
[facility]                                                                              Patient Demographics  Jacob Underwood, is a 56 y.o. male, DOB - [birth year], [id number]  Admit date - 03/03/2016   Admitting Physician Norval Morton, MD  Outpatient Primary MD for the patient is Vikki Ports, MD  Outpatient specialists:   LOS - 0  days    Chief Complaint  Patient presents with  . Chest Pain  . Near Syncope       Brief summary   Patient is a 56 year old male with HTN, DM type II, CVA in 2015, and gout; who presents with complaints of spasms in his chest. Patient reported that his symptoms started couple weeks ago and recently increased in frequency.Spasms occur on the left side, do not radiate, and he notes that he feels as though his chest is tightening up. Symptoms last approximately 1-2 minutes and then usually self resolved. Nothing that he can specifically pick out has provoked, aggravated, and/or relieved symptoms. He admits that symptoms occur at night and have broken them up out of his sleep. He expresses multiple social stressors. Associated symptoms include generalized fatigue, lightheadedness, headache, frequency of urination, polydipsia, increased thirst. Denies having fever, chills, shortness of breath, palpitations, weight change, lower extremity swelling, recent travel, focal weakness. Reports passing out for a few minutes approximately 2 months ago, but was not evaluated for this. Back in 2015 the patient was diagnosed with a stroke which he had loss of part of his vision that lasted 3 months but he notes that his sinusitis back to normal. During this time was when he was diagnosed with hypertension and diabetes but he has not been on those medications in some time due to insurance and financial issues.   Assessment & Plan    Principal Problem:   Atypical Chest pain - Reported intermittent episodes of chest spasm. EKG changes in the inferolateral  leads. - Troponins negative, cardiology consulted, recommended 2-D echo if negative can be DC'd home - No other cardiac workup needed, likely noncardiac  Active Problems:   Diabetes mellitus type 2 in obese ([place]) with hyperglycemia: CBG 341 on admission - Noncompliant, has not been taking any medications due to financial reasons - Started on sliding scale insulin while inpatient - Discussed in detail with the patient about [employer] $4 list, will place on Amaryl 2 mg daily and metformin 1000 mg twice a day upon discharge    Essential hypertension - Noncompliant has not been taking medications due to financial reason - Restarted on lisinopril    History of CVA (cerebrovascular accident) in 2015, felt thrombotic due to risk factors - Should be on aspirin and statin - Lipid panel showed LDL of 118    History of syncope several months ago with Memory loss - Strongly recommended to follow up with PCP and outpatient workup - Patient has not been following with any physician  or taking any of his medications.    Code Status: Full CODE STATUS DVT Prophylaxis:  Lovenox  Family Communication: Discussed in detail with the patient,  all imaging results, lab results explained to the patient   Disposition Plan: Pending 2-D echo  Time Spent in minutes   25 minutes  Procedures:    Consultants:   Cardiology  Antimicrobials:      Medications  Scheduled Meds: . aspirin  162 mg Oral Daily  . enoxaparin (LOVENOX) injection  40 mg Subcutaneous Daily  . insulin aspart  0-9 Units Subcutaneous Q4H  . insulin detemir  5 Units Subcutaneous Daily  . lisinopril  10 mg Oral Daily  . pravastatin  20 mg Oral q1800   Continuous Infusions: . sodium chloride 100 mL/hr at 03/04/16 0823   PRN Meds:.acetaminophen, morphine injection, ondansetron (ZOFRAN) IV   Antibiotics   Anti-infectives    None        Subjective:   Celio Hendriks was seen and examined today.  No chest pain currently.   Patient denies dizziness, shortness of breath, abdominal pain, N/V/D/C, new weakness, numbess, tingling. No acute events overnight.    Objective:   Vitals:   03/03/16 2230 03/03/16 2300 03/04/16 0024 03/04/16 0528  BP: 122/81 123/75 139/80 129/74  Pulse: 87 75 71 68  Resp: 15 16    Temp:   97.6 F (36.4 C) 97.6 F (36.4 C)  TempSrc:   Oral Oral  SpO2: 94% 95% 97% 98%  Weight:   112.4 kg (247 lb 14.4 oz) 112.2 kg (247 lb 6.4 oz)  Height:   6' (1.829 m)     Intake/Output Summary (Last 24 hours) at 03/04/16 1047 Last data filed at 03/04/16 [id number]  Gross per 24 hour  Intake                0 ml  Output                1 ml  Net               -1 ml     Wt Readings from Last 3 Encounters:  03/04/16 112.2 kg (247 lb 6.4 oz)  06/23/14 114.4 kg (252 lb 3.2 oz)  11/06/13 114.3 kg (251 lb 15.8 oz)     Exam  General: Alert and oriented x 3, NAD  HEENT:  PERRLA, EOMI, Anicteric Sclera, mucous membranes moist.   Neck: Supple, no JVD, no masses  Cardiovascular: S1 S2 auscultated, no rubs, murmurs or gallops. Regular rate and rhythm.  Respiratory: Clear to auscultation bilaterally, no wheezing, rales or rhonchi  Gastrointestinal: Obese, Soft, nontender, nondistended, + bowel sounds  Ext: no cyanosis clubbing or edema  Neuro: AAOx3, Cr N's II- XII. Strength 5/5 upper and lower extremities bilaterally  Skin: No rashes  Psych: Normal affect and demeanor, alert and oriented x3    Data Reviewed:  I have personally reviewed following labs and imaging studies  Micro Results No results found for this or any previous visit (from the past 240 hour(s)).  Radiology Reports Dg Chest 2 View  Result Date: 03/03/2016 CLINICAL DATA:  56 year old presenting with intermittent syncope over the past 6 months, presenting with spasmodic chest pain. Current history of diabetes, hypertension, gout. Personal history of stroke and gastric cancer. EXAM: CHEST  2 VIEW COMPARISON:  11/06/2013.  FINDINGS: AP erect and lateral images were obtained. Suboptimal inspiration accounts for atelectasis in the left lower lobe. Lungs otherwise clear. No confluent airspace consolidation. No pleural effusions. Bronchovascular markings normal. Pulmonary vascularity normal. Cardiac silhouette upper normal in size, unchanged. Hilar and mediastinal contours unremarkable and unchanged. Visualized bony thorax intact. IMPRESSION: Suboptimal  inspiration accounts for mild left lower lobe atelectasis. No acute cardiopulmonary disease otherwise. Electronically Signed   By: Evangeline Dakin M.D.   On: 03/03/2016 16:39    Lab Data:  CBC:  Recent Labs Lab 03/03/16 1545 03/04/16 0348  WBC 8.6 6.3  NEUTROABS  --  3.0  HGB 16.3 13.9  HCT 45.6 39.7  MCV 86.9 86.9  PLT 249 [id number]   Basic Metabolic Panel:  Recent Labs Lab 03/03/16 1545 03/03/16 2238 03/04/16 0348  NA 134*  --  139  K 3.9  --  4.0  CL 104  --  109  CO2 19*  --  24  GLUCOSE 327*  --  262*  BUN 15  --  12  CREATININE 0.76  --  0.80  CALCIUM 9.3  --  8.5*  MG  --  1.8  --    GFR: Estimated Creatinine Clearance: 138.1 mL/min (by C-G formula based on SCr of 0.8 mg/dL). Liver Function Tests: No results for input(s): AST, ALT, ALKPHOS, BILITOT, PROT, ALBUMIN in the last 168 hours. No results for input(s): LIPASE, AMYLASE in the last 168 hours. No results for input(s): AMMONIA in the last 168 hours. Coagulation Profile: No results for input(s): INR, PROTIME in the last 168 hours. Cardiac Enzymes:  Recent Labs Lab 03/03/16 2238 03/04/16 0046 03/04/16 0348  TROPONINI <0.03 <0.03 <0.03   BNP (last 3 results) No results for input(s): PROBNP in the last 8760 hours. HbA1C: No results for input(s): HGBA1C in the last 72 hours. CBG:  Recent Labs Lab 03/03/16 1536 03/04/16 0548 03/04/16 0736  GLUCAP 341* 235* 244*   Lipid Profile:  Recent Labs  03/04/16 0348  CHOL 176  HDL 29*  LDLCALC 118*  TRIG 143  CHOLHDL 6.1    Thyroid Function Tests: No results for input(s): TSH, T4TOTAL, FREET4, T3FREE, THYROIDAB in the last 72 hours. Anemia Panel: No results for input(s): VITAMINB12, FOLATE, FERRITIN, TIBC, IRON, RETICCTPCT in the last 72 hours. Urine analysis:    Component Value Date/Time   COLORURINE YELLOW 03/03/2016 2132   APPEARANCEUR CLEAR 03/03/2016 2132   LABSPEC 1.025 03/03/2016 2132   PHURINE 5.0 03/03/2016 2132   GLUCOSEU >=500 (A) 03/03/2016 2132   HGBUR NEGATIVE 03/03/2016 2132   BILIRUBINUR NEGATIVE 03/03/2016 2132   KETONESUR NEGATIVE 03/03/2016 2132   PROTEINUR NEGATIVE 03/03/2016 2132   NITRITE NEGATIVE 03/03/2016 2132   LEUKOCYTESUR NEGATIVE 03/03/2016 2132     Kealan Buchan M.D. [facility] 03/04/2016, 10:47 AM  Pager: [phone] Between 7am to 7pm - call Pager - [phone]  After 7pm go to [email or web address] - password [email or web address]  Call night coverage person covering after 7pm

## 2016-03-05 LAB — GLUCOSE, CAPILLARY: GLUCOSE-CAPILLARY: 252 mg/dL — AB (ref 65–99)

## 2016-03-05 LAB — HEMOGLOBIN A1C
HEMOGLOBIN A1C: 10.7 % — AB (ref 4.8–5.6)
MEAN PLASMA GLUCOSE: 260 mg/dL

## 2017-03-21 ENCOUNTER — Encounter (HOSPITAL_COMMUNITY): Payer: Self-pay | Admitting: Licensed Clinical Social Worker

## 2017-03-21 ENCOUNTER — Ambulatory Visit (INDEPENDENT_AMBULATORY_CARE_PROVIDER_SITE_OTHER): Payer: BLUE CROSS/BLUE SHIELD | Admitting: Licensed Clinical Social Worker

## 2017-03-21 DIAGNOSIS — F332 Major depressive disorder, recurrent severe without psychotic features: Secondary | ICD-10-CM
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
## 2017-03-28 ENCOUNTER — Ambulatory Visit (INDEPENDENT_AMBULATORY_CARE_PROVIDER_SITE_OTHER): Payer: BLUE CROSS/BLUE SHIELD | Admitting: Licensed Clinical Social Worker

## 2017-03-28 ENCOUNTER — Encounter (HOSPITAL_COMMUNITY): Payer: Self-pay | Admitting: Psychiatry

## 2017-03-28 ENCOUNTER — Encounter (HOSPITAL_COMMUNITY): Payer: Self-pay | Admitting: Licensed Clinical Social Worker

## 2017-03-28 DIAGNOSIS — F332 Major depressive disorder, recurrent severe without psychotic features: Secondary | ICD-10-CM

## 2017-03-28 NOTE — Progress Notes (Signed)
   THERAPIST PROGRESS NOTE  Session Time: 3:10-4pm  Participation Level: Active  Behavioral Response: CasualAlert and ConfusedDepressed  Type of Therapy: Individual Therapy  Treatment Goals addressed: Coping  Interventions: CBT  Summary: Jacob Underwood is a 57 y.o. male who presents for his first individual counseling session. Spent a considerable amount of time building a trusting therapeutic relationship. 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 Pt spoke of some health issues including a stroke 3.5 years ago, diabetes and hypertension. He reports he has no dr other than pcp and had no followup after his stroke. Suggested to pt that he contact his PCP and a neurologist for health concerns.  Suicidal/Homicidal: Nowithout intent/plan  Therapist Response: Assessed pt's current functioning and reviewed progress. Assisted pt building a trusting therapeutic relationship. Assisted pt in referrals for health issues.  Plan: Return again in 2 weeks.  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

## 2017-04-08 ENCOUNTER — Ambulatory Visit (INDEPENDENT_AMBULATORY_CARE_PROVIDER_SITE_OTHER): Payer: BLUE CROSS/BLUE SHIELD | Admitting: Licensed Clinical Social Worker

## 2017-04-08 ENCOUNTER — Encounter (HOSPITAL_COMMUNITY): Payer: Self-pay | Admitting: Licensed Clinical Social Worker

## 2017-04-08 DIAGNOSIS — F332 Major depressive disorder, recurrent severe without psychotic features: Secondary | ICD-10-CM

## 2017-04-08 NOTE — Progress Notes (Signed)
   THERAPIST PROGRESS NOTE  Session Time: 3:10-4pm  Participation Level: Active  Behavioral Response: CasualAlert and ConfusedDepressed  Type of Therapy: Individual Therapy  Treatment Goals addressed: Coping  Interventions: CBT  Summary: Jacob Underwood is a 57 y.o. male who presents for his  individual counseling session. Pt discussed his current psychiatric symptoms and current life events. Pt reports he still has considerable amount of depressive symptoms. He has a psychiatry appt 4/4 but trying to get pt an earlier appt. Discussed his diagnosis and symptoms, educating pt on depression. Pt's wife is beginning successfully her career as a Forensic psychologist. She has been reaching out to him to assist her and it has helped with communication. They have started talking to each other as adults. Asked open ended questions and used emotional reflection. Assist pt in building hope and strengthening resilience.      Suicidal/Homicidal: Nowithout intent/plan  Therapist Response: Assessed pt's current functioning and reviewed progress. Assisted pt with communication, emotional reflection, building hope and strenghtening resilience.   Plan: Return again in 2 weeks.  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

## 2017-04-22 ENCOUNTER — Ambulatory Visit (INDEPENDENT_AMBULATORY_CARE_PROVIDER_SITE_OTHER): Payer: BLUE CROSS/BLUE SHIELD | Admitting: Licensed Clinical Social Worker

## 2017-04-22 DIAGNOSIS — F332 Major depressive disorder, recurrent severe without psychotic features: Secondary | ICD-10-CM | POA: Diagnosis not present

## 2017-04-23 ENCOUNTER — Encounter (HOSPITAL_COMMUNITY): Payer: Self-pay | Admitting: Licensed Clinical Social Worker

## 2017-04-23 NOTE — Progress Notes (Signed)
   THERAPIST PROGRESS NOTE  Session Time: 3:10-4pm  Participation Level: Active  Behavioral Response: CasualAlert and ConfusedDepressed  Type of Therapy: Individual Therapy  Treatment Goals addressed: Coping  Interventions: CBT  Summary: Jacob Underwood is a 57 y.o. male who presents for his  individual counseling session. Pt discussed his current psychiatric symptoms and current life events. Pt reports he still has considerable amount of depressive symptoms. Pt discussed his low frustration tolerance with his wife. Asked open ended questions and used empathic reflection in engaging with pt. Taught pt emotional regulation skills for his low tolerance and pt practiced the skills in session. Gave pt handout on emotional regulation skills.    Suicidal/Homicidal: Nowithout intent/plan  Therapist Response: Assessed pt's current functioning and reviewed progress. Assisted pt low frustration tolerance using emotional regulation skills.  Plan: Return again in 2 weeks.  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

## 2017-04-29 ENCOUNTER — Ambulatory Visit (INDEPENDENT_AMBULATORY_CARE_PROVIDER_SITE_OTHER): Payer: BLUE CROSS/BLUE SHIELD | Admitting: Licensed Clinical Social Worker

## 2017-04-29 ENCOUNTER — Encounter (HOSPITAL_COMMUNITY): Payer: Self-pay | Admitting: Licensed Clinical Social Worker

## 2017-04-29 DIAGNOSIS — F332 Major depressive disorder, recurrent severe without psychotic features: Secondary | ICD-10-CM | POA: Diagnosis not present

## 2017-04-29 NOTE — Progress Notes (Signed)
   THERAPIST PROGRESS NOTE  Session Time: 3:10-4pm  Participation Level: Active  Behavioral Response: CasualAlert and ConfusedDepressed  Type of Therapy: Individual Therapy  Treatment Goals addressed: Coping  Interventions: CBT  Summary: Jacob Underwood is a 57 y.o. male who presents for his  individual counseling session. Pt discussed his current psychiatric symptoms and current life events. Pt reports he still has considerable amount of depressive symptoms. He still has an upcoming appt with Dr. Daron Offer, psychiatrist, in April. Pt discussed his upcoimg felony charges. Asked open ended questions using empathic reflection. Discussed with pt options for his court case and how this mayl affect his life. Asked open ended questions. Taught pt meditation skills.    Suicidal/Homicidal: Nowithout intent/plan  Therapist Response: Assessed pt's current functioning and reviewed progress. Assisted pt processing his continued depressive symptoms, upcoming criminal charges and affects on family. Assisted pt processing for the management of his stressors.  Plan: Return again in 2 weeks.  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

## 2017-05-06 ENCOUNTER — Ambulatory Visit (HOSPITAL_COMMUNITY): Payer: Self-pay | Admitting: Licensed Clinical Social Worker

## 2017-05-16 ENCOUNTER — Ambulatory Visit (INDEPENDENT_AMBULATORY_CARE_PROVIDER_SITE_OTHER): Payer: BLUE CROSS/BLUE SHIELD | Admitting: Licensed Clinical Social Worker

## 2017-05-16 ENCOUNTER — Encounter (HOSPITAL_COMMUNITY): Payer: Self-pay | Admitting: Licensed Clinical Social Worker

## 2017-05-16 DIAGNOSIS — F332 Major depressive disorder, recurrent severe without psychotic features: Secondary | ICD-10-CM | POA: Diagnosis not present

## 2017-05-16 NOTE — Progress Notes (Signed)
   THERAPIST PROGRESS NOTE  Session Time: 3:10-4pm  Participation Level: Active  Behavioral Response: CasualAlert and ConfusedDepressed  Type of Therapy: Individual Therapy  Treatment Goals addressed: Coping  Interventions: CBT  Summary: Carrell Rahmani is a 57 y.o. male who presents for his  individual counseling session. Pt discussed his current psychiatric symptoms and current life events.  Pt reports he still has considerable amount of depressive symptoms. He still has an upcoming appt with Dr. Daron Offer, psychiatrist, in April.  Pt has an appt with a new PCP in April as well. He wants a referral to a neurologist and a cardiologist. Pt is working on his mental health and his physical health. Pt was tearful in session. He had his own birthday party in [place].and saw people he has seen in many years. Much of his family didn't come. Asked open ended questions about the tearfulness and used empathic reflection. Asked pt about his takeaway from the party and how this affects his current life. Pt was tearful during the discussion.      Suicidal/Homicidal: Nowithout intent/plan  Therapist Response: Assessed pt's current functioning and reviewed progress. Assisted pt processing his continued depressive symptoms,family issues, physical health. Assisted pt processing for the management of his stressors.  Plan: Return again in 2 weeks.  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

## 2017-05-20 ENCOUNTER — Ambulatory Visit (HOSPITAL_COMMUNITY): Payer: Self-pay | Admitting: Licensed Clinical Social Worker

## 2017-05-27 ENCOUNTER — Ambulatory Visit (HOSPITAL_COMMUNITY): Payer: Self-pay | Admitting: Licensed Clinical Social Worker

## 2017-05-29 ENCOUNTER — Ambulatory Visit: Payer: BLUE CROSS/BLUE SHIELD | Admitting: Family Medicine

## 2017-05-29 ENCOUNTER — Encounter: Payer: Self-pay | Admitting: Family Medicine
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
## 2017-05-30 ENCOUNTER — Ambulatory Visit (INDEPENDENT_AMBULATORY_CARE_PROVIDER_SITE_OTHER): Payer: BLUE CROSS/BLUE SHIELD | Admitting: Psychiatry

## 2017-05-30 ENCOUNTER — Encounter (HOSPITAL_COMMUNITY): Payer: Self-pay | Admitting: Psychiatry

## 2017-05-30 VITALS — Ht 72.0 in | Wt 240.6 lb

## 2017-05-30 DIAGNOSIS — R450 Nervousness: Secondary | ICD-10-CM

## 2017-05-30 DIAGNOSIS — F419 Anxiety disorder, unspecified: Secondary | ICD-10-CM | POA: Diagnosis not present

## 2017-05-30 DIAGNOSIS — R5383 Other fatigue: Secondary | ICD-10-CM | POA: Diagnosis not present

## 2017-05-30 DIAGNOSIS — F331 Major depressive disorder, recurrent, moderate: Secondary | ICD-10-CM

## 2017-05-30 DIAGNOSIS — R41840 Attention and concentration deficit: Secondary | ICD-10-CM

## 2017-05-30 DIAGNOSIS — R0683 Snoring: Secondary | ICD-10-CM | POA: Diagnosis not present

## 2017-05-30 DIAGNOSIS — Z62810 Personal history of physical and sexual abuse in childhood: Secondary | ICD-10-CM | POA: Diagnosis not present

## 2017-05-30 DIAGNOSIS — G4719 Other hypersomnia: Secondary | ICD-10-CM

## 2017-05-30 DIAGNOSIS — G4700 Insomnia, unspecified: Secondary | ICD-10-CM
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
## 2017-05-31 ENCOUNTER — Ambulatory Visit (INDEPENDENT_AMBULATORY_CARE_PROVIDER_SITE_OTHER): Payer: BLUE CROSS/BLUE SHIELD | Admitting: Family Medicine

## 2017-05-31 ENCOUNTER — Encounter (HOSPITAL_COMMUNITY): Payer: Self-pay | Admitting: Psychiatry

## 2017-05-31 ENCOUNTER — Encounter: Payer: Self-pay | Admitting: Family Medicine

## 2017-05-31 ENCOUNTER — Other Ambulatory Visit: Payer: Self-pay

## 2017-05-31 VITALS — BP 136/74 | HR 104 | Temp 97.9°F | Resp 12 | Ht 72.0 in | Wt 240.0 lb

## 2017-05-31 DIAGNOSIS — I10 Essential (primary) hypertension: Secondary | ICD-10-CM

## 2017-05-31 DIAGNOSIS — E1169 Type 2 diabetes mellitus with other specified complication: Secondary | ICD-10-CM

## 2017-05-31 DIAGNOSIS — Z7689 Persons encountering health services in other specified circumstances: Secondary | ICD-10-CM

## 2017-05-31 DIAGNOSIS — E669 Obesity, unspecified: Secondary | ICD-10-CM | POA: Diagnosis not present

## 2017-05-31 NOTE — Patient Instructions (Addendum)
Thank you for coming to see me today. It was a pleasure! Today we talked about:   Establishing care. It was great to meet you. Please continue to work on diet, and check your blood sugar at home and blood pressure if you are able.   Please follow-up with me in 2-4 weeks or as needed.  If you have any questions or concerns, please do not hesitate to call the office at [phone].  Take Care,   [place] Kuuipo Anzaldo, DO

## 2017-05-31 NOTE — Assessment & Plan Note (Signed)
A1c 7.3 yesterday.  Patient on metformin 1000 mg twice daily and glimepiride 2 mg daily.  Patient to continue to work on diet and we will consider changing glimepiride to another option given the recommendations.  Patient will also monitor her blood sugar more frequently than currently doing so given that his A1c is more elevated than previously.

## 2017-05-31 NOTE — Assessment & Plan Note (Signed)
Patient reports elevated blood pressure at psychiatry visit yesterday.  Also minimally elevated today at 136/74.  Patient to monitor at home if able and return in 2-4 weeks for possible increase of lisinopril.

## 2017-06-04 LAB — CBC WITH DIFFERENTIAL/PLATELET
BASOS ABS: 0 10*3/uL (ref 0.0–0.2)
Basos: 1 %
EOS (ABSOLUTE): 0.2 10*3/uL (ref 0.0–0.4)
Eos: 3 %
Hematocrit: 44 % (ref 37.5–51.0)
Hemoglobin: 15.1 g/dL (ref 13.0–17.7)
IMMATURE GRANS (ABS): 0.1 10*3/uL (ref 0.0–0.1)
Immature Granulocytes: 1 %
LYMPHS ABS: 2.3 10*3/uL (ref 0.7–3.1)
LYMPHS: 32 %
MCH: 31.2 pg (ref 26.6–33.0)
MCHC: 34.3 g/dL (ref 31.5–35.7)
MCV: 91 fL (ref 79–97)
MONOCYTES: 9 %
Monocytes Absolute: 0.7 10*3/uL (ref 0.1–0.9)
Neutrophils Absolute: 4 10*3/uL (ref 1.4–7.0)
Neutrophils: 54 %
PLATELETS: 330 10*3/uL (ref 150–379)
RBC: 4.84 x10E6/uL (ref 4.14–5.80)
RDW: 15 % (ref 12.3–15.4)
WBC: 7.3 10*3/uL (ref 3.4–10.8)

## 2017-06-04 LAB — VITAMIN D 1,25 DIHYDROXY
VITAMIN D 1, 25 (OH) TOTAL: 25 pg/mL
VITAMIN D3 1, 25 (OH): 24 pg/mL
Vitamin D2 1, 25 (OH)2: <10 pg/mL

## 2017-06-04 LAB — COMPREHENSIVE METABOLIC PANEL
ALK PHOS: 54 IU/L (ref 39–117)
ALT: 28 IU/L (ref 0–44)
AST: 21 IU/L (ref 0–40)
Albumin/Globulin Ratio: 1.9 (ref 1.2–2.2)
Albumin: 4.6 g/dL (ref 3.5–5.5)
BILIRUBIN TOTAL: 0.2 mg/dL (ref 0.0–1.2)
BUN / CREAT RATIO: 9 (ref 9–20)
BUN: 8 mg/dL (ref 6–24)
CHLORIDE: 106 mmol/L (ref 96–106)
CO2: 21 mmol/L (ref 20–29)
CREATININE: 0.85 mg/dL (ref 0.76–1.27)
Calcium: 10.3 mg/dL — ABNORMAL HIGH (ref 8.7–10.2)
GFR calc Af Amer: 113 mL/min/{1.73_m2} (ref >59)
GFR calc non Af Amer: 98 mL/min/{1.73_m2} (ref >59)
GLUCOSE: 79 mg/dL (ref 65–99)
Globulin, Total: 2.4 g/dL (ref 1.5–4.5)
Potassium: 5.1 mmol/L (ref 3.5–5.2)
Sodium: 144 mmol/L (ref 134–144)
Total Protein: 7 g/dL (ref 6.0–8.5)

## 2017-06-04 LAB — LIPID PANEL WITH LDL/HDL RATIO
CHOLESTEROL TOTAL: 176 mg/dL (ref 100–199)
HDL: 51 mg/dL (ref >39)
LDL Calculated: 95 mg/dL (ref 0–99)
LDl/HDL Ratio: 1.9 ratio (ref 0.0–3.6)
Triglycerides: 149 mg/dL (ref 0–149)
VLDL Cholesterol Cal: 30 mg/dL (ref 5–40)

## 2017-06-04 LAB — TSH+T4F+T3FREE
FREE T4: 1.14 ng/dL (ref 0.82–1.77)
T3 FREE: 3.3 pg/mL (ref 2.0–4.4)
TSH: 1.62 u[IU]/mL (ref 0.450–4.500)

## 2017-06-04 LAB — HEMOGLOBIN A1C
ESTIMATED AVERAGE GLUCOSE: 163 mg/dL
HEMOGLOBIN A1C: 7.3 % — AB (ref 4.8–5.6)

## 2017-06-05 ENCOUNTER — Encounter: Payer: Self-pay | Admitting: Family Medicine

## 2017-06-05 ENCOUNTER — Other Ambulatory Visit: Payer: Self-pay

## 2017-06-05 ENCOUNTER — Ambulatory Visit (INDEPENDENT_AMBULATORY_CARE_PROVIDER_SITE_OTHER): Payer: BLUE CROSS/BLUE SHIELD | Admitting: Family Medicine

## 2017-06-05 VITALS — BP 110/70 | HR 109 | Temp 98.3°F | Ht 72.0 in | Wt 229.0 lb

## 2017-06-05 DIAGNOSIS — G8929 Other chronic pain: Secondary | ICD-10-CM | POA: Diagnosis not present

## 2017-06-05 DIAGNOSIS — M25512 Pain in left shoulder: Secondary | ICD-10-CM | POA: Diagnosis not present

## 2017-06-05 MED ORDER — MELOXICAM 7.5 MG PO TABS: 7.5000 mg | ORAL_TABLET | Freq: Every day | ORAL | 0 refills | Status: DC

## 2017-06-05 NOTE — Progress Notes (Signed)
   Subjective:    Patient ID: Jacob Underwood, male    DOB: 01-12-1961, 57 y.o.   MRN: [id number]  CC: Left shoulder pain  HPI:  Left shoulder pain: -Patient has had left shoulder pain for 20 years.  This pain was treated by chiropractor who reported that it was nerve issues.  Patient describes that it has not been painful for long-standing periods of time since then.  However for the past 4-6 months the pain has been constant. -Patient reports pain is 6-7 out of 10.  Reports that sleeping makes it worse as he rolls over on it while he sleeps.  Patient reports it does help to stretch it overhead.  It is not interfering with his daily activities.  Patient reports that he used to do a lot of weights and played many sports and but stopped this 10 years ago due to his shoulder pain.  -Patient reports taking Tylenol occasionally for the pain but he does not take anything daily and is not interested in starting a new medication for pain -Patient is interested in maybe doing physical therapy or something with sports medicine in order to improve his pain in his shoulder. -Patient reports that he did have imaging at some time about 20 years ago in [place] but has not had any recent imaging.  Smoking status reviewed  Review of Systems Per HPI   Patient Active Problem List   Diagnosis Date Noted  . Chronic left shoulder pain 06/06/2017  . Memory loss 03/04/2016  . History of CVA (cerebrovascular accident) 03/03/2016  . Diabetes mellitus type 2 in obese ([place]) 06/23/2014  . Dyslipidemia 06/23/2014  . Essential hypertension 06/23/2014  . Diplopia 11/06/2013  . Cerebral infarction ([place]) 11/06/2013     Objective:  BP 110/70   Pulse (!) 109   Temp 98.3 F (36.8 C) (Oral)   Ht 6' (1.829 m)   Wt 229 lb (103.9 kg)   SpO2 98%   BMI 31.06 kg/m  Vitals and nursing note reviewed  General: NAD, pleasant Respiratory: normal effort Abdomen: soft, nontender, nondistended. Bowel sounds  present Extremities: no edema or cyanosis. WWP. L. Shoulder: FROM except limited ROM due to pain in adduction to 90 degrees with external rotation. Strength is 5/5 in BLUE. Neg speed's, Neer's, Yergason; Positive Obrien, Hawkins, empty can testing Skin: warm and dry, no rashes noted Neuro: alert and oriented, no focal deficits Psych: normal affect   Assessment & Plan:    Chronic left shoulder pain Patient to have DG shoulder Xray. He was sent home with instructions and bands for stretching and exercises to help strengthen his shoulder. We will do 2 weeks of mobic 7.5mg  (may take 2 if it is not working) for inflammation, along with tylenol as needed for breakthrough pain. Patient willing to work with PT or Sports Medicine to help with controlling pain. His pain is interfering with sleep. To return in 4-6 weeks to re-evaluate pain.     [place] Reagen Haberman, DO Family Medicine Resident PGY-1

## 2017-06-05 NOTE — Patient Instructions (Signed)
Thank you for coming to see me today. It was a pleasure! Today we talked about:   Your left shoulder pain.  Please take Tylenol as needed for pain.  May take 500 mg every 6 hours as needed.  Please also take meloxicam 7.5 mg daily for 2 weeks.  This should help with inflammation.  We have also given you handouts for exercises and stretching to do in the meantime.  If your pain does not resolve in 4-6 weeks please return.  We have also ordered x-ray imaging ofyour shoulder.  Please schedule and have this done.  Please follow-up with me in 4-6 weeks or as needed.  If you have any questions or concerns, please do not hesitate to call the office at [phone].  Take Care,   [place] Idrees Quam, DO

## 2017-06-06 DIAGNOSIS — M25512 Pain in left shoulder: Principal | ICD-10-CM

## 2017-06-06 DIAGNOSIS — G8929 Other chronic pain: Secondary | ICD-10-CM | POA: Insufficient documentation

## 2017-06-06 NOTE — Assessment & Plan Note (Signed)
Patient to have DG shoulder Xray. He was sent home with instructions and bands for stretching and exercises to help strengthen his shoulder. We will do 2 weeks of mobic 7.5mg  (may take 2 if it is not working) for inflammation, along with tylenol as needed for breakthrough pain. Patient willing to work with PT or Sports Medicine to help with controlling pain. His pain is interfering with sleep. To return in 4-6 weeks to re-evaluate pain.

## 2017-06-12 ENCOUNTER — Ambulatory Visit (INDEPENDENT_AMBULATORY_CARE_PROVIDER_SITE_OTHER): Payer: BLUE CROSS/BLUE SHIELD | Admitting: Licensed Clinical Social Worker

## 2017-06-12 ENCOUNTER — Encounter (HOSPITAL_COMMUNITY): Payer: Self-pay | Admitting: Licensed Clinical Social Worker

## 2017-06-12 DIAGNOSIS — F331 Major depressive disorder, recurrent, moderate: Secondary | ICD-10-CM

## 2017-06-12 NOTE — Progress Notes (Signed)
   THERAPIST PROGRESS NOTE  Session Time: 3:10-4pm  Participation Level: Active  Behavioral Response: CasualAlert and ConfusedDepressed  Type of Therapy: Individual Therapy  Treatment Goals addressed: Coping  Interventions: CBT  Summary: Jacob Underwood is a 57 y.o. male who presents for his  individual counseling session. Pt discussed his current psychiatric symptoms and current life events. Since pt's last appt he met with Dr. Daron Offer, psychiarist, PCP. Blood work has been taken and he has an upcoming appt with a neurologist and cardiologist. Validated pt on putting his health first. Pt was also referred for a sleep test. Discussed all the lab work and testing and upcoming dr appts with pt. Pt talked about his marriage and may want to have her accompany him to an appt. Asked open ended questions. Asked pt about his self care and his coping tools. Pt has been walking more and is trying to live a more mindful and intentional life. Asked open ended questions.    Suicidal/Homicidal: Nowithout intent/plan  Therapist Response: Assessed pt's current functioning and reviewed progress. Assisted pt processing his physical health, marriage, self care, mindful and intentional life. Assisted pt processing for the management of his stressors.  Plan: Return again in 2 weeks.  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

## 2017-06-20 ENCOUNTER — Encounter (HOSPITAL_COMMUNITY): Payer: Self-pay | Admitting: Licensed Clinical Social Worker

## 2017-06-20 ENCOUNTER — Ambulatory Visit (INDEPENDENT_AMBULATORY_CARE_PROVIDER_SITE_OTHER): Payer: BLUE CROSS/BLUE SHIELD | Admitting: Licensed Clinical Social Worker

## 2017-06-20 ENCOUNTER — Ambulatory Visit (HOSPITAL_BASED_OUTPATIENT_CLINIC_OR_DEPARTMENT_OTHER): Payer: BLUE CROSS/BLUE SHIELD | Attending: Psychiatry | Admitting: Internal Medicine

## 2017-06-20 VITALS — Ht 72.0 in | Wt 235.0 lb

## 2017-06-20 DIAGNOSIS — G4719 Other hypersomnia: Secondary | ICD-10-CM

## 2017-06-20 DIAGNOSIS — G4733 Obstructive sleep apnea (adult) (pediatric): Secondary | ICD-10-CM

## 2017-06-20 DIAGNOSIS — R5383 Other fatigue: Secondary | ICD-10-CM | POA: Diagnosis not present

## 2017-06-20 DIAGNOSIS — F331 Major depressive disorder, recurrent, moderate: Secondary | ICD-10-CM

## 2017-06-20 DIAGNOSIS — R0683 Snoring: Secondary | ICD-10-CM

## 2017-06-20 NOTE — Progress Notes (Signed)
   THERAPIST PROGRESS NOTE  Session Time: 3:10-4pm  Participation Level: Active  Behavioral Response: Casual/Alert/Euthymic  Type of Therapy: Individual Therapy  Treatment Goals addressed: Coping  Interventions: CBT  Summary: Jacob Underwood is a 57 y.o. male who presents for his  individual counseling session with his wife. Asked pt to bring his wife into session to discuss pt's goals for therapy. Pt was in agreement with request.Pt discussed his current psychiatric symptoms and current life events. Pt talked about his goals for therapy and asked his wife for input on his progress since being in therapy. Asked open ended questions to both pt and his wife. Pt and his wife were able to talk to each other in session about their goals for their marriage, communication, and work/life. Each person was able to learn what's important to each person and see how pt's therapy is assisting pt. Both pt and his wife thought the session went well, they both felt heard.      Suicidal/Homicidal: Nowithout intent/plan  Therapist Response: Assessed pt's current functioning and reviewed progress. Assisted pt processing the session with his wife. Assisted pt processing for the management of his stressors.  Plan: Return again in 2 weeks.  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

## 2017-06-24 ENCOUNTER — Ambulatory Visit (INDEPENDENT_AMBULATORY_CARE_PROVIDER_SITE_OTHER): Payer: BLUE CROSS/BLUE SHIELD | Admitting: Family Medicine

## 2017-06-24 ENCOUNTER — Ambulatory Visit
Admission: RE | Admit: 2017-06-24 | Discharge: 2017-06-24 | Disposition: A | Discharge status: Home / Self Care | Payer: BLUE CROSS/BLUE SHIELD | Source: Ambulatory Visit | Attending: Family Medicine | Admitting: Family Medicine

## 2017-06-24 ENCOUNTER — Encounter: Payer: Self-pay | Admitting: Family Medicine

## 2017-06-24 ENCOUNTER — Other Ambulatory Visit: Payer: Self-pay

## 2017-06-24 VITALS — BP 124/88 | HR 82 | Temp 97.9°F | Ht 72.0 in | Wt 244.0 lb

## 2017-06-24 DIAGNOSIS — G8929 Other chronic pain: Secondary | ICD-10-CM

## 2017-06-24 DIAGNOSIS — M25512 Pain in left shoulder: Secondary | ICD-10-CM | POA: Diagnosis not present

## 2017-06-24 DIAGNOSIS — E119 Type 2 diabetes mellitus without complications: Secondary | ICD-10-CM

## 2017-06-24 MED ORDER — METHYLPREDNISOLONE ACETATE 40 MG/ML IJ SUSP
40.0000 mg | Freq: Once | INTRAMUSCULAR | Status: DC
Start: 2017-06-24 — End: 2018-04-24

## 2017-06-24 MED ORDER — NAPROXEN 375 MG PO TABS: 375.0000 mg | ORAL_TABLET | Freq: Three times a day (TID) | ORAL | 0 refills | Status: DC

## 2017-06-24 NOTE — Patient Instructions (Signed)
Thank you for coming to see me today. It was a pleasure! Today we talked about:   Please stop taking the glimepiride or Amaryl. We will trial you off of this. Please check your blood sugar 2-3 day before meals if possible and record this for me.   Please go to have your X-Ray of your shoulder done as soon as possible.   Please follow-up with me in 1 month or sooner as needed.  If you have any questions or concerns, please do not hesitate to call the office at [phone].  Take Care,   [place] Ezme Duch, DO

## 2017-06-24 NOTE — Progress Notes (Signed)
   Subjective:    Patient ID: Jacob Underwood, male    DOB: [birth year], 57 y.o.   MRN: [id number]   CC: shoulder pain  HPI: Left Shoulder Pain: -Patient returning for left shoulder pain.  Was seen in clinic on 4/10 and instructed to have x-ray of left shoulder done.Patient seen in clinic on 4/10 for chronic l shoulder pain and instructed to have DG Xray performed at that time.  -Patient has not had Xray or heard from sports medicine- reports he did not check his voicemail however - He has reportedly used the mobic as instructed and has done the exercises provided and has noticed that he is now having some pain free days - He also has previously had a steroid injection and saw improvement for about 1 week - He is interested in trying another injection - Pain is still not interfering with his daily activity.  - ROS: Denies numbness or tingling of arm  Diabetes:  Last A1c 7.3 on 4/04 Taking medications: metformin 1000 mg BID, glimepiride 2mg  daily On Aspirin, and on statin ROS: denies dizziness, diaphoresis, LOC, polyuria, polydipsia  Smoking status reviewed  Review of Systems Per HPI   Patient Active Problem List   Diagnosis Date Noted  . Chronic left shoulder pain 06/06/2017  . Memory loss 03/04/2016  . History of CVA (cerebrovascular accident) 03/03/2016  . Diabetes mellitus type 2, noninsulin dependent ([place]) 06/23/2014  . Dyslipidemia 06/23/2014  . Essential hypertension 06/23/2014  . Diplopia 11/06/2013  . Cerebral infarction ([place]) 11/06/2013     Objective:  BP 124/88   Pulse 82   Temp 97.9 F (36.6 C) (Oral)   Ht 6' (1.829 m)   Wt 244 lb (110.7 kg)   SpO2 98%   BMI 33.09 kg/m  Vitals and nursing note reviewed  General: NAD, pleasant L Shoulder: FROM except limited ROM due to pain in adduction to 90 degrees with external rotation. Strength is 5/5 in BLUE. Extremities: no edema or cyanosis. WWP. Skin: warm and dry, no rashes noted Neuro: alert and oriented, no focal  deficits Psych: normal affect  Assessment & Plan:    Chronic left shoulder pain Patient to have Xray done on L shoulder to evaluate for signs of OA.  Patient unable to receive injection today due to having injection 2 months ago and with history of diabetes, will not give more steroids. May be repeated at sports medicine follow up.  placed another referral to sports medicine and patient encouraged to listen to voicemail and inform me if he has not heard from them.  Given naproxen for a couple more days in order to help with the pain as patient believes that he has had improvement and with doing exercises believes that the inflammation may have gone down. Encouraged to take this with food.   Xray performed on 5/01 with no acute signs of OA  Diabetes mellitus type 2, noninsulin dependent ([place]) Patient to continue with metformin 1000 mg BID and has been on glimepiride now for 3 years per his report. Patient interested in decreasing medications. Will trial off of glimepiride and patient is to monitor his sugar daily, and return in 1 month with report.   Reports his sugars have been much better controlled after he has been checking them more. May consider switching to jardiance or victoza if patient does not have better glucose control with diet and exercise change.     [place] Trachelle Low, DO Family Medicine Resident PGY-1

## 2017-06-27 NOTE — Assessment & Plan Note (Signed)
Patient to continue with metformin 1000 mg BID and has been on glimepiride now for 3 years per his report. Patient interested in decreasing medications. Will trial off of glimepiride and patient is to monitor his sugar daily, and return in 1 month with report.   Reports his sugars have been much better controlled after he has been checking them more. May consider switching to jardiance or victoza if patient does not have better glucose control with diet and exercise change.

## 2017-06-27 NOTE — Assessment & Plan Note (Signed)
Patient to have Xray done on L shoulder to evaluate for signs of OA.  Patient unable to receive injection today due to having injection 2 months ago and with history of diabetes, will not give more steroids. May be repeated at sports medicine follow up.  placed another referral to sports medicine and patient encouraged to listen to voicemail and inform me if he has not heard from them.  Given naproxen for a couple more days in order to help with the pain as patient believes that he has had improvement and with doing exercises believes that the inflammation may have gone down. Encouraged to take this with food.   Xray performed on 5/01 with no acute signs of OA

## 2017-06-29 DIAGNOSIS — R0683 Snoring: Secondary | ICD-10-CM | POA: Diagnosis not present

## 2017-06-29 NOTE — Procedures (Signed)
   Patient Name: Jacob Underwood, [place] Date: 06/20/2017 Gender: Male D.O.B: 1960/10/14 Age (years): 57 Referring Provider: Lulu Riding Height (inches): 75 Interpreting Physician: Baird Lyons MD, ABSM Weight (lbs): 235 RPSGT: Zadie Rhine BMI: 32 MRN: [id number] Neck Size: 17.50  CLINICAL INFORMATION Sleep Study Type: NPSG Indication for sleep study: OSA  Epworth Sleepiness Score: 13  SLEEP STUDY TECHNIQUE As per the AASM Manual for the Scoring of Sleep and Associated Events v2.3 (April 2016) with a hypopnea requiring 4% desaturations.  The channels recorded and monitored were frontal, central and occipital EEG, electrooculogram (EOG), submentalis EMG (chin), nasal and oral airflow, thoracic and abdominal wall motion, anterior tibialis EMG, snore microphone, electrocardiogram, and pulse oximetry.  MEDICATIONS Medications self-administered by patient taken the night of the study : none reported  SLEEP ARCHITECTURE The study was initiated at 9:49:22 PM and ended at 3:44:45 AM.  Sleep onset time was 4.7 minutes and the sleep efficiency was 79.1%%. The total sleep time was 281 minutes.  Stage REM latency was 164.0 minutes.  The patient spent 2.3%% of the night in stage N1 sleep, 37.0%% in stage N2 sleep, 46.1%% in stage N3 and 14.59% in REM.  Alpha intrusion was absent.  Supine sleep was 100.00%.  RESPIRATORY PARAMETERS The overall apnea/hypopnea index (AHI) was 10.5 per hour. There were 3 total apneas, including 3 obstructive, 0 central and 0 mixed apneas. There were 46 hypopneas and 41 RERAs.  The AHI during Stage REM sleep was 35.1 per hour.  AHI while supine was 10.5 per hour.  The mean oxygen saturation was 92.9%. The minimum SpO2 during sleep was 77.0%.  loud snoring was noted during this study.  CARDIAC DATA The 2 lead EKG demonstrated sinus rhythm. The mean heart rate was 88.8 beats per minute. Other EKG findings include: PACs.  LEG MOVEMENT DATA The total  PLMS were 0 with a resulting PLMS index of 0.0. Associated arousal with leg movement index was 0.0 .  IMPRESSIONS - Mild obstructive sleep apnea occurred during this study (AHI = 10.5/h). - Insufficient early events to meet protocolrequirement for split CPAP titration. - No significant central sleep apnea occurred during this study (CAI = 0.0/h). - Moderate oxygen desaturation was noted during this study (Min O2 = 77.0%). Mean 92.9%. - The patient snored with loud snoring volume. - Cardiac rhythm- occasional PACs. - Clinically significant periodic limb movements did not occur during sleep. No significant associated arousals.  DIAGNOSIS - Obstructive Sleep Apnea (327.23 [G47.33 ICD-10])  RECOMMENDATIONS - Treatment for mild OSA is directed by symptoms. If conservative measures such as weight loss are insufficient, consider CPAP titration study, DME autopap, or a fitted oral appliance, based on clinical judgment. - Positional therapy avoiding supine position during sleep. - Be careful with alcohol, sedatives and other CNS depressants that may worsen sleep apnea and disrupt normal sleep architecture. - Sleep hygiene should be reviewed to assess factors that may improve sleep quality. - Weight management and regular exercise should be initiated or continued if appropriate.  [Electronically signed] 06/29/2017 11:27 AM  Baird Lyons MD, ABSM Diplomate, American Board of Sleep Medicine   NPI: [id number]                          [place]  ELECTRONICALLY SIGNED ON:  06/29/2017, 11:22 AM [place] PH: [phone]   FX: [phone] [place]

## 2017-07-03 ENCOUNTER — Ambulatory Visit (INDEPENDENT_AMBULATORY_CARE_PROVIDER_SITE_OTHER): Payer: BLUE CROSS/BLUE SHIELD | Admitting: Family Medicine

## 2017-07-03 VITALS — BP 126/88 | Ht 72.0 in | Wt 240.0 lb

## 2017-07-03 DIAGNOSIS — M25512 Pain in left shoulder: Secondary | ICD-10-CM | POA: Diagnosis not present

## 2017-07-03 DIAGNOSIS — G8929 Other chronic pain: Secondary | ICD-10-CM | POA: Diagnosis not present

## 2017-07-03 MED ORDER — METHYLPREDNISOLONE ACETATE 40 MG/ML IJ SUSP
40.0000 mg | Freq: Once | INTRAMUSCULAR | Status: AC
  Administered 2017-07-03: 40 mg via INTRA_ARTICULAR

## 2017-07-03 MED ORDER — MELOXICAM 15 MG PO TABS: 15.0000 mg | ORAL_TABLET | Freq: Every day | ORAL | 2 refills | Status: DC

## 2017-07-03 NOTE — Patient Instructions (Signed)
You have rotator cuff impingement Try to avoid painful activities (overhead activities, lifting with extended arm) as much as possible. Meloxicam as needed for pain and inflammation.. Can take tylenol in addition to this. Subacromial injection may be beneficial to help with pain and to decrease inflammation - you were given this today. Start physical therapy for a more extensive home exercise program. Do home exercise program with theraband and scapular stabilization exercises daily 3 sets of 10 once a day. If not improving at follow-up we will consider further imaging and/or nitro patches. Follow up with me in 6 weeks but call me sooner if you want to try the patches.

## 2017-07-04 ENCOUNTER — Encounter: Payer: Self-pay | Admitting: Family Medicine
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
## 2017-07-04 NOTE — Assessment & Plan Note (Signed)
2/2 rotator cuff impingement.  Shown home exercises and stretches to do daily.  Meloxicam.  Subacromial injection given as well.  Start physical therapy.  Consider imaging, nitro patches if not improving.  After informed written consent timeout was performed, patient was seated on exam table. Left shoulder was prepped with alcohol swab and utilizing posterior approach, patient's left subacromial space was injected with 3:1 marcaine: depomedrol. Patient tolerated the procedure well without immediate complications.

## 2017-07-17 ENCOUNTER — Encounter (HOSPITAL_COMMUNITY): Payer: Self-pay | Admitting: Licensed Clinical Social Worker

## 2017-07-17 ENCOUNTER — Ambulatory Visit (INDEPENDENT_AMBULATORY_CARE_PROVIDER_SITE_OTHER): Payer: BLUE CROSS/BLUE SHIELD | Admitting: Licensed Clinical Social Worker

## 2017-07-17 DIAGNOSIS — F331 Major depressive disorder, recurrent, moderate: Secondary | ICD-10-CM

## 2017-07-17 NOTE — Progress Notes (Signed)
   THERAPIST PROGRESS NOTE  Session Time: 4:10-5pm  Participation Level: Active  Behavioral Response: Casual/Alert/Euthymic  Type of Therapy: Individual Therapy  Treatment Goals addressed: Coping  Interventions: CBT  Summary: Jacob Underwood is a 57 y.o. male who presents for his  individual counseling session.Pt discussed his current psychiatric symptoms and current life events. Discussed pt's goals for tx and reviewed tx plan. Pt wants to focus on: career, physical health, childhood, family (marriage & children). Asked open ended questions about how to move forward with each component. Pt discussed some of his childhood, especially his sister Marlowe Kays running away from home when he was 33 and how this affected him. Asked open ended questions and used empathic reflection.      Suicidal/Homicidal: Nowithout intent/plan  Therapist Response: Assessed pt's current functioning and reviewed progress. Assisted pt processing focus for therapy, tx plan review, childhood memories. Assisted pt processing for the management of his stressors.  Plan: Return again in 2 weeks.  Diagnosis: Axis I: Moderated episode of recurrent major depressive disorder  MACKENZIE,LISBETH S, LCAS 07/17/17

## 2017-07-25 ENCOUNTER — Ambulatory Visit (INDEPENDENT_AMBULATORY_CARE_PROVIDER_SITE_OTHER): Payer: BLUE CROSS/BLUE SHIELD | Admitting: Psychiatry

## 2017-07-25 ENCOUNTER — Encounter (HOSPITAL_COMMUNITY): Payer: Self-pay | Admitting: Psychiatry

## 2017-07-25 VITALS — BP 132/74 | HR 87 | Ht 72.0 in | Wt 248.0 lb

## 2017-07-25 DIAGNOSIS — F331 Major depressive disorder, recurrent, moderate: Secondary | ICD-10-CM | POA: Diagnosis not present

## 2017-07-25 DIAGNOSIS — I63411 Cerebral infarction due to embolism of right middle cerebral artery: Secondary | ICD-10-CM

## 2017-07-25 MED ORDER — FLUOXETINE HCL 20 MG PO CAPS: 20.0000 mg | ORAL_CAPSULE | Freq: Every day | ORAL | 0 refills | Status: DC

## 2017-07-25 MED ORDER — FLUOXETINE HCL 10 MG PO CAPS
10.0000 mg | ORAL_CAPSULE | Freq: Every day | ORAL | 0 refills | Status: DC
Start: 2017-07-25 — End: 2017-09-11
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
## 2017-08-01 ENCOUNTER — Ambulatory Visit (HOSPITAL_COMMUNITY): Payer: Self-pay | Admitting: Licensed Clinical Social Worker

## 2017-08-01 ENCOUNTER — Encounter

## 2017-08-15 ENCOUNTER — Ambulatory Visit (INDEPENDENT_AMBULATORY_CARE_PROVIDER_SITE_OTHER): Payer: BLUE CROSS/BLUE SHIELD | Admitting: Licensed Clinical Social Worker

## 2017-08-15 ENCOUNTER — Encounter (HOSPITAL_COMMUNITY): Payer: Self-pay | Admitting: Licensed Clinical Social Worker

## 2017-08-15 DIAGNOSIS — F331 Major depressive disorder, recurrent, moderate: Secondary | ICD-10-CM

## 2017-08-15 NOTE — Progress Notes (Signed)
   THERAPIST PROGRESS NOTE  Session Time: 4:10-5pm  Participation Level: Active  Behavioral Response: Casual/Alert/Euthymic  Type of Therapy: Individual Therapy  Treatment Goals addressed: Coping  Interventions: CBT  Summary: Jacob Underwood is a 57 y.o. male who presents for his  individual counseling session.Pt discussed his current psychiatric symptoms and current life events. Pt reports he met with his psychiatrist, Dr. Daron Offer, and his medications were discussed. Pt feels his moods have stabilized. Pt came today wanting to talk about problem solving and accountability within his relationships. Discussed this with pt. Asked open ended questions. Pt struggles with accountability within his marriage. Role played with pt how to communicate his wants and needs within the relationship.         Suicidal/Homicidal: Nowithout intent/plan  Therapist Response: Assessed pt's current functioning and reviewed progress. Assisted pt processing accountability and problem solving in relationships. Assisted pt processing for the management of his stressors.  Plan: Return again in 2 weeks.  Diagnosis: Axis I: Moderated episode of recurrent major depressive disorder  MACKENZIE,LISBETH S, LCAS 08/15/17

## 2017-09-05 ENCOUNTER — Ambulatory Visit (HOSPITAL_COMMUNITY): Payer: Self-pay | Admitting: Licensed Clinical Social Worker

## 2017-09-11 ENCOUNTER — Encounter (HOSPITAL_COMMUNITY): Payer: Self-pay | Admitting: Psychiatry

## 2017-09-11 ENCOUNTER — Ambulatory Visit (INDEPENDENT_AMBULATORY_CARE_PROVIDER_SITE_OTHER): Payer: BLUE CROSS/BLUE SHIELD | Admitting: Psychiatry

## 2017-09-11 DIAGNOSIS — F331 Major depressive disorder, recurrent, moderate: Secondary | ICD-10-CM | POA: Diagnosis not present

## 2017-09-11 DIAGNOSIS — I63411 Cerebral infarction due to embolism of right middle cerebral artery: Secondary | ICD-10-CM | POA: Diagnosis not present

## 2017-09-11 MED ORDER — FLUOXETINE HCL 40 MG PO CAPS: 40.0000 mg | ORAL_CAPSULE | Freq: Every day | ORAL | 0 refills | Status: DC
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
## 2017-09-24 DIAGNOSIS — R972 Elevated prostate specific antigen [PSA]: Secondary | ICD-10-CM | POA: Diagnosis not present

## 2017-09-26 ENCOUNTER — Ambulatory Visit (INDEPENDENT_AMBULATORY_CARE_PROVIDER_SITE_OTHER): Payer: BLUE CROSS/BLUE SHIELD | Admitting: Neurology

## 2017-09-26 ENCOUNTER — Encounter: Payer: Self-pay | Admitting: Neurology

## 2017-09-26 ENCOUNTER — Telehealth: Payer: Self-pay | Admitting: Neurology

## 2017-09-26 VITALS — BP 130/82 | HR 78 | Ht 72.0 in | Wt 253.4 lb

## 2017-09-26 DIAGNOSIS — I639 Cerebral infarction, unspecified: Secondary | ICD-10-CM

## 2017-09-26 DIAGNOSIS — R55 Syncope and collapse: Secondary | ICD-10-CM

## 2017-09-26 NOTE — Telephone Encounter (Signed)
Patient sent to [facility] [place] authorization [id number] (10/25/17). DW

## 2017-09-26 NOTE — Patient Instructions (Addendum)
MRI brain and EEG  Vasovagal Syncope, Adult Syncope, which is commonly known as fainting or passing out, is a temporary loss of consciousness. It occurs when the blood flow to the brain is reduced. Vasovagal syncope, also called neurocardiogenic syncope, is a fainting spell that happens when blood flow to the brain is reduced because of a sudden drop in heart rate and blood pressure. Vasovagal syncope is usually harmless. However, you can get injured if you fall during a fainting spell. What are the causes? This condition is caused by a drop in heart rate and blood pressure, usually in response to a trigger. Many things and situations can trigger an episode, including:  Pain.  Fear.  The sight of blood. This may occur during medical procedures, such as when blood is being drawn from a vein.  Common activities, such as coughing, swallowing, stretching, or going to the bathroom.  Emotional stress.  Being in a confined space.  Prolonged standing, especially in a warm environment.  Lack of sleep or rest.  Not eating for a long time.  Not drinking enough liquids.  Recent illness.  Drinking alcohol.  Taking drugs that affect blood pressure, such as marijuana, cocaine, opiates, or inhalants.  What are the signs or symptoms? Before a fainting episode, you may:  Feel dizzy or light-headed.  Become pale.  Sense that you are going to faint.  Feel like the room is spinning.  Only see directly ahead (tunnel vision).  Feel sick to your stomach (nauseous).  See spots.  Slowly lose vision.  Hear ringing in your ears.  Have a headache.  Feel warm and sweaty.  Feel a sensation of pins and needles.  During the fainting spell, you may twitch or make jerky movements. Fainting spells usually last no longer than a few minutes before you wake up. If you get up too quickly before your body can recover, you may faint again. How is this diagnosed? This condition is diagnosed based  on your symptoms, your medical history, and a physical exam. Tests may be done to rule out other causes of fainting. Tests may include:  Blood tests.  Heart tests, such as an electrocardiogram (ECG), echocardiogram, or electrophysiology study.  A test to check your response to changes in position (tilt table test).  How is this treated? Usually, treatment is not needed for this condition. Your health care provider may suggest ways to help prevent fainting episodes. These may include:  Drinking additional fluids if you are exposed to a trigger.  Sitting or lying down if you notice signs that an episode is coming.  If your fainting spells continue, your health care provider may recommend that you:  Take medicines to prevent fainting or to help reduce further episodes of fainting.  Do certain exercises.  Wear compression stockings.  Have surgery to place a pacemaker in your body (rare).  Follow these instructions at home:  Learn to identify the signs that an episode is coming.  Sit or lie down at the first sign of a fainting spell. If you sit down, put your head down between your legs. If you lie down, swing your legs up in the air to increase blood flow to the brain.  Avoid hot tubs and saunas.  Avoid standing for a long time. If you have to stand for a long time, try: ? Crossing your legs. ? Flexing and stretching your leg muscles. ? Squatting. ? Moving your legs. ? Bending over.  Drink enough fluid to keep  your urine clear or pale yellow.  Make changes to your diet that your health care provider recommends. You may be told to: ? Avoid caffeine. ? Eat more salt.  Take over-the-counter and prescription medicines only as told by your health care provider. Contact a health care provider if:  You continue to have fainting spells despite treatment.  You faint more often despite treatment.  You lose consciousness for more than a few minutes.  You faint during or after  exercising or after being startled.  You have twitching or jerky movements for longer than a few seconds during a fainting spell.  You have an episode of twitching or jerky movements without fainting. Get help right away if:  A fainting spell leads to an injury or bleeding.  You have new symptoms that occur with the fainting spells, such as: ? Shortness of breath. ? Chest pain. ? Irregular heartbeat.  You twitch or make jerky movements for more than 5 minutes.  You twitch or make jerky movements during more than one fainting spell. This information is not intended to replace advice given to you by your health care provider. Make sure you discuss any questions you have with your health care provider. Document Released: 01/30/2012 Document Revised: 07/27/2015 Document Reviewed: 12/11/2014 Elsevier Interactive Patient Education  2018 [employer].   Stroke Prevention Some medical conditions and behaviors are associated with a higher chance of having a stroke. You can help prevent a stroke by making nutrition, lifestyle, and other changes, including managing any medical conditions you may have. What nutrition changes can be made?  Eat healthy foods. You can do this by: ? Choosing foods high in fiber, such as fresh fruits and vegetables and whole grains. ? Eating at least 5 or more servings of fruits and vegetables a day. Try to fill half of your plate at each meal with fruits and vegetables. ? Choosing lean protein foods, such as lean cuts of meat, poultry without skin, fish, tofu, beans, and nuts. ? Eating low-fat dairy products. ? Avoiding foods that are high in salt (sodium). This can help lower blood pressure. ? Avoiding foods that have saturated fat, trans fat, and cholesterol. This can help prevent high cholesterol. ? Avoiding processed and premade foods.  Follow your health care provider's specific guidelines for losing weight, controlling high blood pressure (hypertension),  lowering high cholesterol, and managing diabetes. These may include: ? Reducing your daily calorie intake. ? Limiting your daily sodium intake to 1,500 milligrams (mg). ? Using only healthy fats for cooking, such as olive oil, canola oil, or sunflower oil. ? Counting your daily carbohydrate intake. What lifestyle changes can be made?  Maintain a healthy weight. Talk to your health care provider about your ideal weight.  Get at least 30 minutes of moderate physical activity at least 5 days a week. Moderate activity includes brisk walking, biking, and swimming.  Do not use any products that contain nicotine or tobacco, such as cigarettes and e-cigarettes. If you need help quitting, ask your health care provider. It may also be helpful to avoid exposure to secondhand smoke.  Limit alcohol intake to no more than 1 drink a day for nonpregnant women and 2 drinks a day for men. One drink equals 12 oz of beer, 5 oz of wine, or 1 oz of hard liquor.  Stop any illegal drug use.  Avoid taking birth control pills. Talk to your health care provider about the risks of taking birth control pills if: ? You  are over 30 years old. ? You smoke. ? You get migraines. ? You have ever had a blood clot. What other changes can be made?  Manage your cholesterol levels. ? Eating a healthy diet is important for preventing high cholesterol. If cholesterol cannot be managed through diet alone, you may also need to take medicines. ? Take any prescribed medicines to control your cholesterol as told by your health care provider.  Manage your diabetes. ? Eating a healthy diet and exercising regularly are important parts of managing your blood sugar. If your blood sugar cannot be managed through diet and exercise, you may need to take medicines. ? Take any prescribed medicines to control your diabetes as told by your health care provider.  Control your hypertension. ? To reduce your risk of stroke, try to keep your  blood pressure below 130/80. ? Eating a healthy diet and exercising regularly are an important part of controlling your blood pressure. If your blood pressure cannot be managed through diet and exercise, you may need to take medicines. ? Take any prescribed medicines to control hypertension as told by your health care provider. ? Ask your health care provider if you should monitor your blood pressure at home. ? Have your blood pressure checked every year, even if your blood pressure is normal. Blood pressure increases with age and some medical conditions.  Get evaluated for sleep disorders (sleep apnea). Talk to your health care provider about getting a sleep evaluation if you snore a lot or have excessive sleepiness.  Take over-the-counter and prescription medicines only as told by your health care provider. Aspirin or blood thinners (antiplatelets or anticoagulants) may be recommended to reduce your risk of forming blood clots that can lead to stroke.  Make sure that any other medical conditions you have, such as atrial fibrillation or atherosclerosis, are managed. What are the warning signs of a stroke? The warning signs of a stroke can be easily remembered as BEFAST.  B is for balance. Signs include: ? Dizziness. ? Loss of balance or coordination. ? Sudden trouble walking.  E is for eyes. Signs include: ? A sudden change in vision. ? Trouble seeing.  F is for face. Signs include: ? Sudden weakness or numbness of the face. ? The face or eyelid drooping to one side.  A is for arms. Signs include: ? Sudden weakness or numbness of the arm, usually on one side of the body.  S is for speech. Signs include: ? Trouble speaking (aphasia). ? Trouble understanding.  T is for time. ? These symptoms may represent a serious problem that is an emergency. Do not wait to see if the symptoms will go away. Get medical help right away. Call your local emergency services (911 in the [place] Do not  drive yourself to the hospital.  Other signs of stroke may include: ? A sudden, severe headache with no known cause. ? Nausea or vomiting. ? Seizure.  Where to find more information: For more information, visit:  American Stroke Association: www.strokeassociation.org  National Stroke Association: www.stroke.org  Summary  You can prevent a stroke by eating healthy, exercising, not smoking, limiting alcohol intake, and managing any medical conditions you may have.  Do not use any products that contain nicotine or tobacco, such as cigarettes and e-cigarettes. If you need help quitting, ask your health care provider. It may also be helpful to avoid exposure to secondhand smoke.  Remember BEFAST for warning signs of stroke. Get help right away if you  or a loved one has any of these signs. This information is not intended to replace advice given to you by your health care provider. Make sure you discuss any questions you have with your health care provider. Document Released: 03/22/2004 Document Revised: 03/20/2016 Document Reviewed: 03/20/2016 Elsevier Interactive Patient Education  [employer].
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
## 2017-09-27 LAB — BASIC METABOLIC PANEL
BUN / CREAT RATIO: 16 (ref 9–20)
BUN: 15 mg/dL (ref 6–24)
CO2: 22 mmol/L (ref 20–29)
CREATININE: 0.92 mg/dL (ref 0.76–1.27)
Calcium: 9.2 mg/dL (ref 8.7–10.2)
Chloride: 103 mmol/L (ref 96–106)
GFR, EST AFRICAN AMERICAN: 108 mL/min/{1.73_m2} (ref >59)
GFR, EST NON AFRICAN AMERICAN: 93 mL/min/{1.73_m2} (ref >59)
Glucose: 272 mg/dL — ABNORMAL HIGH (ref 65–99)
Potassium: 5 mmol/L (ref 3.5–5.2)
Sodium: 140 mmol/L (ref 134–144)

## 2017-09-30 ENCOUNTER — Telehealth: Payer: Self-pay | Admitting: *Deleted

## 2017-09-30 ENCOUNTER — Ambulatory Visit (INDEPENDENT_AMBULATORY_CARE_PROVIDER_SITE_OTHER): Payer: BLUE CROSS/BLUE SHIELD | Admitting: Neurology

## 2017-09-30 DIAGNOSIS — R55 Syncope and collapse: Secondary | ICD-10-CM | POA: Diagnosis not present

## 2017-09-30 DIAGNOSIS — I639 Cerebral infarction, unspecified: Secondary | ICD-10-CM | POA: Diagnosis not present

## 2017-09-30 NOTE — Telephone Encounter (Signed)
Called pt & LVM asking for call back. Left office number in message.  °

## 2017-09-30 NOTE — Procedures (Signed)
    History:  Jacob Underwood is a 57 year old patient with a history of diabetes.  The patient has had episodes of syncope unassociated with incontinence of the urine or tongue biting.  The patient is being evaluated for these events to exclude the possibility of seizures.  This is a routine EEG.  No skull defects are noted.  Medications include aspirin, Prozac, Nicotrol, lisinopril, Mevacor, Mobic, and Glucophage.  EEG classification: Normal awake and asleep  Description of the recording: The background rhythms of this recording consists of a fairly well modulated medium amplitude background activity of 9 Hz. As the record progresses, the patient initially is in the waking state, but appears to enter the early stage II sleep during the recording, with rudimentary sleep spindles and vertex sharp wave activity seen. During the wakeful state, photic stimulation is performed, and this results in a bilateral and symmetric photic driving response. Hyperventilation was also performed, and this results in a minimal buildup of the background rhythm activities without significant slowing seen. At no time during the recording does there appear to be evidence of spike or spike wave discharges or evidence of focal slowing. EKG monitor shows no evidence of cardiac rhythm abnormalities with a heart rate of 72.  Impression: This is a normal EEG recording in the waking and sleeping state. No evidence of ictal or interictal discharges were seen at any time during the recording.

## 2017-09-30 NOTE — Telephone Encounter (Signed)
-----   Message from Melvenia Beam, MD sent at 09/27/2017 11:08 AM EDT ----- Glucose is significantly elevated at 272, needs control of glucose and follow up with pcp as this is quite high. thanks

## 2017-09-30 NOTE — Telephone Encounter (Signed)
Pt has returned the call to RN Bethany, he is asking for a call back. °

## 2017-09-30 NOTE — Telephone Encounter (Signed)
Spoke with pt and informed him that his glucose was quite high at 272. This needs to be under control. Advised pt to f/u with PCP. Pt verbalized understanding and appreciation for call.

## 2017-10-02 ENCOUNTER — Encounter (HOSPITAL_COMMUNITY): Payer: Self-pay | Admitting: Licensed Clinical Social Worker

## 2017-10-02 ENCOUNTER — Ambulatory Visit (INDEPENDENT_AMBULATORY_CARE_PROVIDER_SITE_OTHER): Payer: BLUE CROSS/BLUE SHIELD | Admitting: Licensed Clinical Social Worker

## 2017-10-02 DIAGNOSIS — F331 Major depressive disorder, recurrent, moderate: Secondary | ICD-10-CM | POA: Diagnosis not present

## 2017-10-02 NOTE — Progress Notes (Signed)
   THERAPIST PROGRESS NOTE  Session Time: 3:10-4pm  Participation Level: Active  Behavioral Response: Casual/Alert/Euthymic  Type of Therapy: Individual Therapy  Treatment Goals addressed: Coping  Interventions: CBT  Summary: Jacob Underwood is a 57 y.o. male who presents for his  individual counseling session.Pt discussed his current psychiatric symptoms and current life events. Pt reports he met with his psychiatrist, Dr. Daron Offer, and his medication was increased. Pt feels his moods have stabilized. Pt has followed up with neurology appointment. Pt was interested in getting a referral for his.wife. Discussed his wife's needs and referred to her PCP within the [facility] system. Pt shared he feels more settled with his life. 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 His court case is still pending but his attorney has helped him understand what may happen in court. Pt discussed his marriage at length. Asked open ended questions and used empathic reflection. Pt discussed his children and next steps for his family. Assisted pt with problem solving.   Suicidal/Homicidal: Nowithout intent/plan  Therapist Response: Assessed pt's current functioning and reviewed progress. Assisted pt processing business ventures, family and child relationships, accountability and problem solving in relationships. Assisted pt processing for the management of his stressors.  Plan: Return again in 2 weeks.  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

## 2017-10-09 NOTE — Progress Notes (Signed)
Jacob Underwood, eeg was normal no suggestion of seizure d/o

## 2017-10-11 ENCOUNTER — Telehealth: Payer: Self-pay | Admitting: *Deleted

## 2017-10-11 NOTE — Telephone Encounter (Addendum)
-----   Message from Melvenia Beam, MD sent at 10/09/2017  7:27 PM EDT ----- Romelle Starcher, eeg was normal no suggestion of seizure d/o  ----- Message ----- From: Kathrynn Ducking, MD Sent: 09/30/2017   5:02 PM EDT To: Melvenia Beam, MD, [place] Shirley, DO

## 2017-10-11 NOTE — Telephone Encounter (Signed)
Informed pt that his EEG was negative. No suggestion of seizure disorder. Patient verbalized understanding and appreciation. He stated he had not heard about MRI scheduling. RN advised it looks like they LVM for him yest 8/15. Gave pt GI # to call [phone] to schedule. He verbalized appreciation.

## 2017-10-16 ENCOUNTER — Ambulatory Visit (HOSPITAL_COMMUNITY): Payer: Self-pay | Admitting: Licensed Clinical Social Worker

## 2017-11-14 ENCOUNTER — Encounter (HOSPITAL_COMMUNITY): Payer: Self-pay | Admitting: Licensed Clinical Social Worker

## 2017-11-14 ENCOUNTER — Ambulatory Visit (INDEPENDENT_AMBULATORY_CARE_PROVIDER_SITE_OTHER): Payer: BLUE CROSS/BLUE SHIELD | Admitting: Licensed Clinical Social Worker

## 2017-11-14 DIAGNOSIS — F331 Major depressive disorder, recurrent, moderate: Secondary | ICD-10-CM | POA: Diagnosis not present

## 2017-11-14 NOTE — Progress Notes (Signed)
   THERAPIST PROGRESS NOTE  Session Time: 3: 40-4:30pm  Participation Level: Active  Behavioral Response: Casual/Alert/Euthymic  Type of Therapy: Individual Therapy  Treatment Goals addressed: Coping  Interventions: CBT  Summary: Jacob Underwood is a 57 y.o. male who presents for his  individual counseling session.Pt discussed his current psychiatric symptoms and current life events. Pt was dressed very nice. 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 Asked open ended questions. Pt reports Dr. Daron Offer referred him to Dr. Adele Schilder, psychiatrist, here at the clinic. Pt reports his meds seem to be working well. Pt shared his self esteem has improved. He continues to use positive affirmations daily. Asked open ended questions. Pt wanted to discuss his struggle with relationships. Discussed relationship circles and boundaries. Role played using boundaries. Pt continues to feel more settled with his life. Asked open ended questions and discussed next steps. Will continue this conversation at next session.      Suicidal/Homicidal: Nowithout intent/plan  Therapist Response: Assessed pt's current functioning and reviewed progress. Assisted pt processing business, self esteem, relationships and boundaries. Assisted pt processing for the management of his stressors.  Plan: Return again in 2 weeks.  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

## 2017-11-18 DIAGNOSIS — R972 Elevated prostate specific antigen [PSA]: Secondary | ICD-10-CM | POA: Diagnosis not present

## 2017-11-28 ENCOUNTER — Ambulatory Visit (HOSPITAL_COMMUNITY): Payer: Self-pay | Admitting: Licensed Clinical Social Worker

## 2017-12-12 ENCOUNTER — Ambulatory Visit (HOSPITAL_COMMUNITY): Payer: Self-pay | Admitting: Psychiatry

## 2017-12-25 NOTE — Progress Notes (Deleted)
  Subjective:    Patient ID: Jacob Underwood, male    DOB: 1960-09-19, 57 y.o.   MRN: [id number]   CC: diabetes ***  HPI: Diabetes:  Last A1c 7.3 on 05/30/17 Taking medications: metformin 1000 mg BID, *** On Aspirin, and on statin*** Last eye exam: recently *** Last foot exam: up to date ROS: denies dizziness, diaphoresis, LOC, polyuria, polydipsia   Smoking status reviewed  ROS: 10 point ROS is otherwise negative, except as mentioned in HPI  Patient Active Problem List   Diagnosis Date Noted  . Chronic left shoulder pain 06/06/2017  . Memory loss 03/04/2016  . History of CVA (cerebrovascular accident) 03/03/2016  . Diabetes mellitus type 2, noninsulin dependent ([place]) 06/23/2014  . Dyslipidemia 06/23/2014  . Essential hypertension 06/23/2014  . Diplopia 11/06/2013  . Cerebral infarction ([place]) 11/06/2013     Objective:  There were no vitals taken for this visit. Vitals and nursing note reviewed  General: NAD, pleasant Cardiac: RRR, normal heart sounds, no murmurs Respiratory: CTAB, normal effort Abdomen: soft, nontender, nondistended Extremities: no edema or cyanosis. WWP. Skin: warm and dry, no rashes noted Neuro: alert and oriented, no focal deficits Psych: normal affect  Assessment & Plan:    No problem-specific Assessment & Plan notes found for this encounter.    [place] Kamilah Correia, DO Family Medicine Resident PGY-2

## 2017-12-26 ENCOUNTER — Ambulatory Visit: Payer: Medicaid Other | Admitting: Family Medicine

## 2018-02-10 ENCOUNTER — Other Ambulatory Visit (HOSPITAL_COMMUNITY): Payer: Self-pay

## 2018-02-10 DIAGNOSIS — I63411 Cerebral infarction due to embolism of right middle cerebral artery: Secondary | ICD-10-CM

## 2018-02-10 DIAGNOSIS — F331 Major depressive disorder, recurrent, moderate: Secondary | ICD-10-CM

## 2018-02-10 MED ORDER — FLUOXETINE HCL 40 MG PO CAPS: 40.0000 mg | ORAL_CAPSULE | Freq: Every day | ORAL | 0 refills | Status: DC

## 2018-03-08 ENCOUNTER — Other Ambulatory Visit: Payer: Self-pay | Admitting: Family Medicine

## 2018-03-29 ENCOUNTER — Ambulatory Visit (INDEPENDENT_AMBULATORY_CARE_PROVIDER_SITE_OTHER): Payer: Medicaid Other | Admitting: Psychiatry

## 2018-03-29 ENCOUNTER — Encounter (HOSPITAL_COMMUNITY): Payer: Self-pay | Admitting: Psychiatry

## 2018-03-29 DIAGNOSIS — F331 Major depressive disorder, recurrent, moderate: Secondary | ICD-10-CM

## 2018-03-29 MED ORDER — FLUOXETINE HCL 40 MG PO CAPS: 40.0000 mg | ORAL_CAPSULE | Freq: Every day | ORAL | 0 refills | Status: DC
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
## 2018-04-24 ENCOUNTER — Other Ambulatory Visit: Payer: Self-pay

## 2018-04-24 ENCOUNTER — Ambulatory Visit: Payer: Medicaid Other | Admitting: Family Medicine

## 2018-04-24 ENCOUNTER — Encounter: Payer: Self-pay | Admitting: Family Medicine

## 2018-04-24 VITALS — BP 140/80 | HR 66 | Temp 98.2°F | Wt 246.1 lb

## 2018-04-24 DIAGNOSIS — Z8673 Personal history of transient ischemic attack (TIA), and cerebral infarction without residual deficits: Secondary | ICD-10-CM | POA: Diagnosis not present

## 2018-04-24 DIAGNOSIS — E119 Type 2 diabetes mellitus without complications: Secondary | ICD-10-CM

## 2018-04-24 DIAGNOSIS — Z1159 Encounter for screening for other viral diseases: Secondary | ICD-10-CM

## 2018-04-24 LAB — POCT GLYCOSYLATED HEMOGLOBIN (HGB A1C): HbA1c, POC (controlled diabetic range): 7.3 % — AB (ref 0.0–7.0)

## 2018-04-24 MED ORDER — LIRAGLUTIDE 18 MG/3ML ~~LOC~~ SOPN: 1.2000 mg | PEN_INJECTOR | Freq: Every day | SUBCUTANEOUS | 3 refills | Status: DC

## 2018-04-24 NOTE — Progress Notes (Signed)
Subjective:  Patient ID: Jacob Underwood  DOB: 1961-01-21 MRN: [id number]  Jacob Underwood is a 58 y.o. male with a PMH of T2DM, history of CVA (2015), hypertension here today for follow-up for diabetes.   HPI:  Diabetes:  Last A1c 7.3 on 05/30/2017 Taking medications: metformin 1000 mg, glipizide On Aspirin, and on statin, states he just restarted taking these back in December. Last eye exam:  States that he has an appointment coming up Last foot exam:  Refusing exam today, reports he checks his feet at home ROS: denies diaphoresis, LOC, polyuria, polydipsia; reports some dizziness- worse in the morning, has been seen by neurology for this and had an MRI ordered at that time.  History of CVA Patient reports that at his last neurology appointment he had an MRI with and without contrast ordered by the neurologist and was unable to have this scheduled.  Patient requesting to schedule this today.  Health maintenance Patient is due for hepatitis C screening. Due for colonoscopy  ROS: All other systems otherwise negative, except as mentioned in HPI   Social hx: Denies use of illicit drugs, alcohol use Smoking status reviewed  Patient Active Problem List   Diagnosis Date Noted  . Chronic left shoulder pain 06/06/2017  . Memory loss 03/04/2016  . History of CVA (cerebrovascular accident) 03/03/2016  . Diabetes mellitus type 2, noninsulin dependent ([place]) 06/23/2014  . Dyslipidemia 06/23/2014  . Essential hypertension 06/23/2014  . Diplopia 11/06/2013  . Cerebral infarction ([place]) 11/06/2013     Objective:  BP 140/80   Pulse 66   Temp 98.2 F (36.8 C) (Oral)   Wt 246 lb 2 oz (111.6 kg)   SpO2 97%   BMI 33.38 kg/m   Vitals and nursing note reviewed  General: NAD, pleasant Cardiac: RRR, normal heart sounds, no m/r/g Pulm: normal effort, CTAB Extremities: no edema or cyanosis. WWP. Skin: warm and dry, no rashes noted Neuro: alert and oriented, no focal deficits Psych: normal affect,  normal thought content  Assessment & Plan:   Diabetes mellitus type 2, noninsulin dependent (HCC) A1c remains 7.3 today.  Patient reports that he has been working on his diet a lot more.  Does report that he is having some dizziness.  States he has not been checking his blood sugar as he should.  Patient has related this however to some low blood sugars.  From previous conversations would like to stop patient's glipizide at this time, and patient is agreeable.    Discussed options including Jardiance versus Victoza.  Will start on Victoza injections as patient reports that he would also like the benefit of possible weight loss. Pharmacy went over how to use Victoza with patient in office and was also given some samples.  Prescription sent to his pharmacy.  Obtain BMP today.  History of CVA (cerebrovascular accident) Patient with previous history of CVA and at last neurology visit patient was reporting some dizziness along with syncopal episodes as well as some memory problems.  An MRI was ordered at that time however patient was unable to obtain this test.  Will reorder MRI per patient request in order to further evaluate his previous syncopal episodes.  Patient does report that he has not had any more syncopal episodes since that visit however he is continuing to have some dizziness.  This could be related to his glipizide which we are changing however will obtain imaging to rule out other etiologies.  Denies any red flag symptoms such as gait change, nausea  or vomiting.   Jacob Markail Diekman, DO Family Medicine Resident PGY-2

## 2018-04-24 NOTE — Progress Notes (Signed)
Patient educated on purpose, proper use and potential adverse effects of Victoza.  Following instruction patient verbalized understanding of treatment plan.   Janae Bridgeman, PharmD PGY1 Pharmacy Resident Phone: [phone] 04/24/2018 12:04 PM

## 2018-04-24 NOTE — Patient Instructions (Addendum)
Thank you for coming to see me today. It was a pleasure! Today we talked about:   Start taking your lisinopril at night with your cholesterol medication.   Please have your MRI done as scheduled.  We will call you with your lab results if they are abnormal.  We have started you on a new medication called Victoza.  Please inject this into the skin once daily starting at 0.6 mg/day.  After 1 week please increase this to 1.2 mg daily as instructed by the pharmacist.  We have provided you with some samples I have also sent a prescription to your pharmacy.  Please call if you have any concerns or questions.  Please follow-up with me in 3 months or as needed.  If you have any questions or concerns, please do not hesitate to call the office at [phone].  Take Care,   [place] Travaris Kosh, DO

## 2018-04-25 LAB — CBC
Hematocrit: 42.5 % (ref 37.5–51.0)
Hemoglobin: 14.4 g/dL (ref 13.0–17.7)
MCH: 31.2 pg (ref 26.6–33.0)
MCHC: 33.9 g/dL (ref 31.5–35.7)
MCV: 92 fL (ref 79–97)
Platelets: 302 10*3/uL (ref 150–450)
RBC: 4.62 x10E6/uL (ref 4.14–5.80)
RDW: 13.2 % (ref 11.6–15.4)
WBC: 5.6 10*3/uL (ref 3.4–10.8)

## 2018-04-25 LAB — HEPATITIS C ANTIBODY: Hep C Virus Ab: <0.1 s/co ratio (ref 0.0–0.9)

## 2018-04-25 LAB — BASIC METABOLIC PANEL
BUN/Creatinine Ratio: 13 (ref 9–20)
BUN: 11 mg/dL (ref 6–24)
CHLORIDE: 100 mmol/L (ref 96–106)
CO2: 23 mmol/L (ref 20–29)
Calcium: 9.4 mg/dL (ref 8.7–10.2)
Creatinine, Ser: 0.87 mg/dL (ref 0.76–1.27)
GFR calc Af Amer: 112 mL/min/{1.73_m2} (ref >59)
GFR calc non Af Amer: 97 mL/min/{1.73_m2} (ref >59)
Glucose: 183 mg/dL — ABNORMAL HIGH (ref 65–99)
Potassium: 4.9 mmol/L (ref 3.5–5.2)
SODIUM: 138 mmol/L (ref 134–144)

## 2018-04-28 MED ORDER — TETANUS-DIPHTH-ACELL PERTUSSIS 5-2-15.5 LF-MCG/0.5 IM SUSP: 0.5000 mL | Freq: Once | INTRAMUSCULAR | 0 refills | Status: AC

## 2018-04-28 NOTE — Assessment & Plan Note (Signed)
Patient with previous history of CVA and at last neurology visit patient was reporting some dizziness along with syncopal episodes as well as some memory problems.  An MRI was ordered at that time however patient was unable to obtain this test.  Will reorder MRI per patient request in order to further evaluate his previous syncopal episodes.  Patient does report that he has not had any more syncopal episodes since that visit however he is continuing to have some dizziness.  This could be related to his glipizide which we are changing however will obtain imaging to rule out other etiologies.  Denies any red flag symptoms such as gait change, nausea or vomiting.

## 2018-04-28 NOTE — Assessment & Plan Note (Signed)
A1c remains 7.3 today.  Patient reports that he has been working on his diet a lot more.  Does report that he is having some dizziness.  States he has not been checking his blood sugar as he should.  Patient has related this however to some low blood sugars.  From previous conversations would like to stop patient's glipizide at this time, and patient is agreeable.    Discussed options including Jardiance versus Victoza.  Will start on Victoza injections as patient reports that he would also like the benefit of possible weight loss. Pharmacy went over how to use Victoza with patient in office and was also given some samples.  Prescription sent to his pharmacy.  Obtain BMP today.

## 2018-05-04 ENCOUNTER — Ambulatory Visit
Admission: RE | Admit: 2018-05-04 | Discharge: 2018-05-04 | Disposition: A | Discharge status: Home / Self Care | Payer: Medicaid Other | Source: Ambulatory Visit | Attending: Family Medicine | Admitting: Family Medicine

## 2018-05-04 DIAGNOSIS — R413 Other amnesia: Secondary | ICD-10-CM | POA: Diagnosis not present

## 2018-05-04 DIAGNOSIS — Z8673 Personal history of transient ischemic attack (TIA), and cerebral infarction without residual deficits: Secondary | ICD-10-CM

## 2018-05-04 DIAGNOSIS — R55 Syncope and collapse: Secondary | ICD-10-CM | POA: Diagnosis not present

## 2018-05-04 MED ORDER — GADOBENATE DIMEGLUMINE 529 MG/ML IV SOLN
20.0000 mL | Freq: Once | INTRAVENOUS | Status: AC | PRN
  Administered 2018-05-04: 20 mL via INTRAVENOUS

## 2018-06-24 ENCOUNTER — Ambulatory Visit (HOSPITAL_COMMUNITY): Payer: Medicaid Other | Admitting: Psychiatry

## 2018-06-24 ENCOUNTER — Telehealth (HOSPITAL_COMMUNITY): Payer: Self-pay

## 2018-06-24 DIAGNOSIS — F331 Major depressive disorder, recurrent, moderate: Secondary | ICD-10-CM

## 2018-06-24 MED ORDER — FLUOXETINE HCL 40 MG PO CAPS: 40.0000 mg | ORAL_CAPSULE | Freq: Every day | ORAL | 0 refills | Status: DC

## 2018-06-24 NOTE — Telephone Encounter (Signed)
Medication management - Telephone message left for patient that a new order for his Prozac 40mg , 1 a day, #90 had been sent to his [facility] on [address] per approval of Dr. Dwyane Dee in Dr. Marguerite Olea absence today. Requested patient keep his rescheduled appointment with Dr. Adele Schilder on 06/30/18 and order e-scribed as verbally instructed by Dr. Dwyane Dee.

## 2018-06-30 ENCOUNTER — Other Ambulatory Visit: Payer: Self-pay

## 2018-06-30 ENCOUNTER — Encounter (HOSPITAL_COMMUNITY): Payer: Self-pay | Admitting: Psychiatry

## 2018-06-30 ENCOUNTER — Ambulatory Visit (INDEPENDENT_AMBULATORY_CARE_PROVIDER_SITE_OTHER): Payer: Medicaid Other | Admitting: Psychiatry

## 2018-06-30 DIAGNOSIS — F331 Major depressive disorder, recurrent, moderate: Secondary | ICD-10-CM

## 2018-06-30 MED ORDER — FLUOXETINE HCL 40 MG PO CAPS: 40.0000 mg | ORAL_CAPSULE | Freq: Every day | ORAL | 0 refills | Status: DC

## 2018-06-30 NOTE — Progress Notes (Signed)
Virtual Visit via Telephone Note  I connected with Jacob Underwood on 06/30/18 at  9:20 AM EDT by telephone and verified that I am speaking with the correct person using two identifiers.   I discussed the limitations, risks, security and privacy concerns of performing an evaluation and management service by telephone and the availability of in person appointments. I also discussed with the patient that there may be a patient responsible charge related to this service. The patient expressed understanding and agreed to proceed.   History of Present Illness: Patient was evaluated through phone session.  He is taking his medication as prescribed.  He denies any crying spells or any irritability.  He feels the medicine helping him and he is motivated.  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  He is pleased that his daughter is going to [employer].  He noticed since started the new medicine Victoza for diabetes he lost 14 pounds.  He denies any anger, mood swings, feeling of hopelessness or worthlessness.  He like to continue Prozac 40 mg is helping his depression.  Patient has a history of diabetes and history of TIA with no residual symptoms.  Patient denies any side effects of the medication.  He denies drinking or using any illegal substances.  He lives with his wife and his daughter.   Past Psychiatric History: Reviewed. Patient has no past history of psychiatric inpatient treatment or any suicidal attempt.  He saw briefly at [facility] and prescribed Adderall instead of Cymbalta.  No history of psychosis or mania.    Observations/Objective: Mental status examination done on the phone.  Patient describes his mood euthymic.  His speech is soft, clear, coherent with normal tone and volume.  His thought process logical and goal-directed.  His attention concentration is okay.  There  were no flight of ideas or loose association.  He denies any auditory or visual hallucination.  He denies any active or passive suicidal thoughts or homicidal thought.  There were no delusions or any paranoia.  He is alert and oriented x3.  His fund of knowledge is adequate.  His cognition is intact.  There were no grandiosity.  His insight judgment and impulse control is okay.  Assessment and Plan: Major depressive disorder, recurrent.  Patient is a stable on Prozac 40 mg daily.  His depression is stable and he does not have any side effects.  He is not seeing Charolotte Eke because he feels he does not need it however he realized if symptoms started to get worse then he will contact us for therapy.  Discussed medication side effects and benefits.  Recommended to call us back if is any question or any concern.  Continue Prozac 40 mg daily.  Follow-up in 4 months.  Follow Up Instructions:    I discussed the assessment and treatment plan with the patient. The patient was provided an opportunity to ask questions and all were answered. The patient agreed with the plan and demonstrated an understanding of the instructions.   The patient was advised to call back or seek an in-person evaluation if the symptoms worsen or if the condition fails to improve as anticipated.  I provided 20 minutes of non-face-to-face time during this encounter.   Kathlee Nations, MD

## 2018-07-17 ENCOUNTER — Ambulatory Visit (INDEPENDENT_AMBULATORY_CARE_PROVIDER_SITE_OTHER): Payer: Self-pay | Admitting: Family Medicine

## 2018-07-17 ENCOUNTER — Other Ambulatory Visit: Payer: Self-pay

## 2018-07-17 DIAGNOSIS — L255 Unspecified contact dermatitis due to plants, except food: Secondary | ICD-10-CM

## 2018-07-17 DIAGNOSIS — E785 Hyperlipidemia, unspecified: Secondary | ICD-10-CM

## 2018-07-17 MED ORDER — PREDNISONE 10 MG PO TABS: ORAL_TABLET | ORAL | 0 refills | Status: DC

## 2018-07-17 MED ORDER — LOVASTATIN 20 MG PO TABS: 20.0000 mg | ORAL_TABLET | Freq: Every day | ORAL | 0 refills | Status: DC

## 2018-07-17 NOTE — Patient Instructions (Signed)
Thank you for coming in to see [place] today. Please see below to review our plan for today's visit.  Take the prednisone as prescribed.  We will need to do a tapered dose to prevent a rebound rash.  You can apply over-the-counter topical medications to alleviate itch and hydrate skin.  Your symptoms should resolve within 2 weeks.  Please let us know immediately if there is any changes to your vision.  Please call the clinic at [phone] if your symptoms worsen or you have any concerns. It was our pleasure to serve you.  Harriet Butte, [place], PGY-3

## 2018-07-17 NOTE — Progress Notes (Signed)
   Subjective   Patient ID: Jacob Underwood    DOB: 1960-11-21, 58 y.o. male   MRN: [id number]  CC: "I have poison ivy"  HPI: Jacob Underwood is a 58 y.o. male who presents to clinic today for the following:  RASH  Had rash for 4 days. Location: legs and arms, more recently his right eye Medications tried: none Similar rash in past: yes, poison ivy  New medications or antibiotics: no Tick, Insect or new pet exposure: no Recent travel: no New detergent or soap: no Immunocompromised: no  Symptoms Itching: yes Pain over rash: yes Feeling ill all over: no Fever: no Mouth sores: no Face or tongue swelling: no Trouble breathing: no Joint swelling or pain: no   ROS: see HPI for pertinent.  [place]: Reviewed. Smoking status reviewed. Medications reviewed.  Objective   BP 110/74   Pulse 88   SpO2 93%  Vitals and nursing note reviewed.  General: well nourished, well developed, NAD with non-toxic appearance HEENT: normocephalic, atraumatic, moist mucous membranes, mild erythema of right eye localized to superior eyelid without scleral erythema, PERRLA, EOMI Lungs: normal work of breathing Extremities: warm and well perfused, normal tone, no edema, multiple raised linear rash with diffuse distribution on lower extremities and upper extremities, subtle linear rash on superior eyelid  Assessment & Plan   Rhus dermatitis Secondary to poison ivy.  Does have facial exposure mended to right superior eyelid.  No red flags including anaphylaxis. - Given prednisone taper - Advised to use over-the-counter topical anti-itch ointments as needed and discussed importance of proper protection for prevention - Reviewed return precautions  Dyslipidemia Diabetic.  Recently ran out of lovastatin.  Requesting refill for medication. - Refill lovastatin 20 mg daily  No orders of the defined types were placed in this encounter.  Meds ordered this encounter  Medications  . predniSONE (DELTASONE) 10 MG  tablet    Sig: TAKE 5 TABS DAILY FOR 5 DAYS, THEN 4 TABS DAILY FOR 1 DAY, THEN 3 TABS DAILY FOR 2 DAYS, THEN 2 TABS DAILY FOR 3 DAYS, THEN 1 TAB DAILY FOR 3 DAYS.    Dispense:  50 tablet    Refill:  0  . lovastatin (MEVACOR) 20 MG tablet    Sig: Take 1 tablet (20 mg total) by mouth at bedtime.    Dispense:  90 tablet    Refill:  0    Harriet Butte, DO [facility], PGY-3 07/17/2018, 11:02 AM

## 2018-07-17 NOTE — Assessment & Plan Note (Signed)
Diabetic.  Recently ran out of lovastatin.  Requesting refill for medication. - Refill lovastatin 20 mg daily

## 2018-07-17 NOTE — Assessment & Plan Note (Signed)
Secondary to poison ivy.  Does have facial exposure mended to right superior eyelid.  No red flags including anaphylaxis. - Given prednisone taper - Advised to use over-the-counter topical anti-itch ointments as needed and discussed importance of proper protection for prevention - Reviewed return precautions

## 2018-08-04 ENCOUNTER — Encounter: Payer: Self-pay | Admitting: Family Medicine

## 2018-08-04 ENCOUNTER — Other Ambulatory Visit: Payer: Self-pay | Admitting: Family Medicine

## 2018-08-05 NOTE — Telephone Encounter (Signed)
Patient reported he never picked it up, he should not require refill. Is this for his original script? This may better be served to send to the doctor who evaluated him, Dr. Yisroel Ramming.

## 2018-08-06 NOTE — Telephone Encounter (Signed)
Patient needs an appointment, it would not be appropriate to refill this medication without evaluation of the patient.

## 2018-08-08 ENCOUNTER — Other Ambulatory Visit: Payer: Self-pay

## 2018-08-08 ENCOUNTER — Telehealth: Payer: Medicaid Other | Admitting: Family Medicine

## 2018-08-08 DIAGNOSIS — R21 Rash and other nonspecific skin eruption: Secondary | ICD-10-CM

## 2018-10-13 ENCOUNTER — Other Ambulatory Visit: Payer: Self-pay

## 2018-10-13 DIAGNOSIS — Z20822 Contact with and (suspected) exposure to covid-19: Secondary | ICD-10-CM

## 2018-10-15 LAB — NOVEL CORONAVIRUS, NAA: SARS-CoV-2, NAA: NOT DETECTED

## 2018-10-29 ENCOUNTER — Other Ambulatory Visit: Payer: Self-pay

## 2018-10-29 ENCOUNTER — Ambulatory Visit (HOSPITAL_COMMUNITY): Payer: Medicaid Other | Admitting: Psychiatry

## 2019-04-03 ENCOUNTER — Other Ambulatory Visit: Payer: Medicaid Other

## 2019-06-06 ENCOUNTER — Other Ambulatory Visit (HOSPITAL_COMMUNITY): Payer: Self-pay | Admitting: Psychiatry

## 2019-06-06 DIAGNOSIS — F331 Major depressive disorder, recurrent, moderate: Secondary | ICD-10-CM

## 2019-06-19 ENCOUNTER — Other Ambulatory Visit: Payer: Self-pay | Admitting: *Deleted

## 2019-06-19 ENCOUNTER — Other Ambulatory Visit (HOSPITAL_COMMUNITY): Payer: Self-pay | Admitting: Psychiatry

## 2019-06-19 ENCOUNTER — Other Ambulatory Visit: Payer: Self-pay | Admitting: Family Medicine

## 2019-06-19 DIAGNOSIS — F331 Major depressive disorder, recurrent, moderate: Secondary | ICD-10-CM

## 2019-06-20 MED ORDER — LOVASTATIN 20 MG PO TABS: 20.0000 mg | ORAL_TABLET | Freq: Every day | ORAL | 0 refills | Status: DC

## 2019-06-23 ENCOUNTER — Other Ambulatory Visit: Payer: Self-pay

## 2019-06-23 ENCOUNTER — Ambulatory Visit: Payer: Medicaid Other | Admitting: Family Medicine

## 2019-06-23 ENCOUNTER — Encounter: Payer: Self-pay | Admitting: Family Medicine

## 2019-06-23 VITALS — BP 132/78 | HR 93 | Ht 71.0 in | Wt 242.0 lb

## 2019-06-23 DIAGNOSIS — L980 Pyogenic granuloma: Secondary | ICD-10-CM

## 2019-06-23 DIAGNOSIS — I10 Essential (primary) hypertension: Secondary | ICD-10-CM | POA: Diagnosis not present

## 2019-06-23 DIAGNOSIS — F331 Major depressive disorder, recurrent, moderate: Secondary | ICD-10-CM | POA: Diagnosis not present

## 2019-06-23 DIAGNOSIS — E119 Type 2 diabetes mellitus without complications: Secondary | ICD-10-CM

## 2019-06-23 LAB — POCT GLYCOSYLATED HEMOGLOBIN (HGB A1C): HbA1c, POC (controlled diabetic range): 7.3 % — AB (ref 0.0–7.0)

## 2019-06-23 MED ORDER — FLUOXETINE HCL 40 MG PO CAPS: 40.0000 mg | ORAL_CAPSULE | Freq: Every day | ORAL | 0 refills | Status: DC

## 2019-06-23 MED ORDER — VICTOZA 18 MG/3ML ~~LOC~~ SOPN: 1.8000 mg | PEN_INJECTOR | Freq: Every day | SUBCUTANEOUS | 3 refills | Status: DC

## 2019-06-23 MED ORDER — LOVASTATIN 20 MG PO TABS: 20.0000 mg | ORAL_TABLET | Freq: Every day | ORAL | 0 refills | Status: AC

## 2019-06-23 NOTE — Patient Instructions (Signed)
Thank you for coming to see me today. It was a pleasure! Today we talked about:   Please increase her Victoza to 1.8 mg/day.  If you develop any nausea after increasing the dose then you may decrease to 1.2.  However sometimes the nausea will resolve after 1 week.  We have removed the area on your leg today.  If it returns or worsens then do not hesitate to return to see if they can be removed again.  Otherwise keep the area clean and dry using warm water with soap to clean the area.  You may use the topical antibiotic such as Neosporin.  I will release your results on MyChart or give you a call if there is something abnormal.  Please follow-up with our clinic in 6 months or sooner as needed.  If you have any questions or concerns, please do not hesitate to call the office at [phone].  Take Care,   [place] Sugey Trevathan, DO

## 2019-06-23 NOTE — Progress Notes (Signed)
   SUBJECTIVE:   CHIEF COMPLAINT / HPI:   Pyogenic Granuloma: Patient has noticed growth on left lower leg for past 6 months that has gotten larger. No irritation or pain. Has never had this before. Has been growing bigger.   Diabetes: Blood Sugar ranges-has not been checking regularly but have had some that are around 120 Medications: metformin 1000mg  bid, victoza 1.2mg  daily Compliance: yes Hypoglycemic symptoms: no on statin Last eye exam: recently Last foot exam: needs foot exam ROS: denies dizziness, diaphoresis, LOC, polyuria, polydipsia  Hypertension: - Medications: lisinopril 10mg  - Compliance: yes - Checking BP at home: no - Denies any SOB, CP, vision changes, LE edema, medication SEs, or symptoms of hypotension - Diet: has been eating more little ceasars over the past few months but is trying to decrease   PERTINENT  PMH / PSH: T2DM, h/o CVA, HTN  OBJECTIVE:  BP 132/78   Pulse 93   Ht 5\' 11"  (1.803 m)   Wt 242 lb (109.8 kg)   SpO2 97%   BMI 33.75 kg/m   General: NAD, pleasant Neck: Supple, no LAD Cardiovascular: no LE edema Respiratory:  normal work of breathing Psych: AOx3, appropriate affect     ASSESSMENT/PLAN:   Diabetes mellitus type 2, noninsulin dependent (HCC) A1c stable at 7.3. Patient stable on metformin and victoza. No adverse affects. Has recently been having difficulties with maintaining diet, but will work on that. Will increase victoza to 1.8mg  for better control and if patient develops any gi upset will decrease back to 1.2mg . He has also been out of medication for 10 days.   Essential hypertension At goal at 132/78. Continue lisinopril. BMP, lipid panel and cbc today.   Pyogenic granuloma Located on left lower leg. Patient verbally consented for removal in office with silver nitrate. Most of granuloma was removed. Patient instructed to return if it has regrowth or there are any signs of infection.     [place] Kayloni Rocco, DO PGY-3, [facility]

## 2019-06-24 LAB — CBC
Hematocrit: 45 % (ref 37.5–51.0)
Hemoglobin: 15.7 g/dL (ref 13.0–17.7)
MCH: 31.3 pg (ref 26.6–33.0)
MCHC: 34.9 g/dL (ref 31.5–35.7)
MCV: 90 fL (ref 79–97)
Platelets: 284 10*3/uL (ref 150–450)
RBC: 5.01 x10E6/uL (ref 4.14–5.80)
RDW: 13.3 % (ref 11.6–15.4)
WBC: 8 10*3/uL (ref 3.4–10.8)

## 2019-06-24 LAB — LIPID PANEL
Chol/HDL Ratio: 5.2 ratio — ABNORMAL HIGH (ref 0.0–5.0)
Cholesterol, Total: 196 mg/dL (ref 100–199)
HDL: 38 mg/dL — ABNORMAL LOW (ref >39)
LDL Chol Calc (NIH): 117 mg/dL — ABNORMAL HIGH (ref 0–99)
Triglycerides: 232 mg/dL — ABNORMAL HIGH (ref 0–149)
VLDL Cholesterol Cal: 41 mg/dL — ABNORMAL HIGH (ref 5–40)

## 2019-06-24 LAB — BASIC METABOLIC PANEL
BUN/Creatinine Ratio: 18 (ref 9–20)
BUN: 19 mg/dL (ref 6–24)
CO2: 21 mmol/L (ref 20–29)
Calcium: 9.8 mg/dL (ref 8.7–10.2)
Chloride: 102 mmol/L (ref 96–106)
Creatinine, Ser: 1.07 mg/dL (ref 0.76–1.27)
GFR calc Af Amer: 89 mL/min/{1.73_m2} (ref >59)
GFR calc non Af Amer: 77 mL/min/{1.73_m2} (ref >59)
Glucose: 104 mg/dL — ABNORMAL HIGH (ref 65–99)
Potassium: 4.6 mmol/L (ref 3.5–5.2)
Sodium: 138 mmol/L (ref 134–144)

## 2019-06-26 DIAGNOSIS — L980 Pyogenic granuloma: Secondary | ICD-10-CM | POA: Insufficient documentation

## 2019-06-26 NOTE — Assessment & Plan Note (Signed)
At goal at 132/78. Continue lisinopril. BMP, lipid panel and cbc today.

## 2019-06-26 NOTE — Assessment & Plan Note (Signed)
A1c stable at 7.3. Patient stable on metformin and victoza. No adverse affects. Has recently been having difficulties with maintaining diet, but will work on that. Will increase victoza to 1.8mg  for better control and if patient develops any gi upset will decrease back to 1.2mg . He has also been out of medication for 10 days.

## 2019-06-26 NOTE — Assessment & Plan Note (Signed)
Located on left lower leg. Patient verbally consented for removal in office with silver nitrate. Most of granuloma was removed. Patient instructed to return if it has regrowth or there are any signs of infection.

## 2019-10-08 ENCOUNTER — Other Ambulatory Visit: Payer: Self-pay

## 2019-10-08 DIAGNOSIS — F331 Major depressive disorder, recurrent, moderate: Secondary | ICD-10-CM

## 2019-10-08 MED ORDER — FLUOXETINE HCL 40 MG PO CAPS: 40.0000 mg | ORAL_CAPSULE | Freq: Every day | ORAL | 0 refills | Status: DC

## 2020-01-31 ENCOUNTER — Other Ambulatory Visit: Payer: Self-pay | Admitting: Family Medicine

## 2020-01-31 DIAGNOSIS — F331 Major depressive disorder, recurrent, moderate: Secondary | ICD-10-CM

## 2020-03-01 ENCOUNTER — Other Ambulatory Visit: Payer: Self-pay

## 2020-03-02 MED ORDER — VICTOZA 18 MG/3ML ~~LOC~~ SOPN: 1.8000 mg | PEN_INJECTOR | Freq: Every day | SUBCUTANEOUS | 3 refills | Status: AC

## 2020-03-16 DIAGNOSIS — E785 Hyperlipidemia, unspecified: Secondary | ICD-10-CM | POA: Diagnosis not present

## 2020-03-16 DIAGNOSIS — E1169 Type 2 diabetes mellitus with other specified complication: Secondary | ICD-10-CM | POA: Diagnosis not present

## 2020-03-16 DIAGNOSIS — I10 Essential (primary) hypertension: Secondary | ICD-10-CM | POA: Diagnosis not present

## 2020-04-29 DIAGNOSIS — F330 Major depressive disorder, recurrent, mild: Secondary | ICD-10-CM | POA: Diagnosis not present

## 2020-04-29 DIAGNOSIS — E785 Hyperlipidemia, unspecified: Secondary | ICD-10-CM | POA: Diagnosis not present

## 2020-04-29 DIAGNOSIS — Z130 Encounter for screening for diseases of the blood and blood-forming organs and certain disorders involving the immune mechanism: Secondary | ICD-10-CM | POA: Diagnosis not present

## 2020-04-29 DIAGNOSIS — R413 Other amnesia: Secondary | ICD-10-CM | POA: Diagnosis not present

## 2020-04-29 DIAGNOSIS — Z125 Encounter for screening for malignant neoplasm of prostate: Secondary | ICD-10-CM | POA: Diagnosis not present

## 2020-04-29 DIAGNOSIS — I10 Essential (primary) hypertension: Secondary | ICD-10-CM | POA: Diagnosis not present

## 2020-04-29 DIAGNOSIS — Z8673 Personal history of transient ischemic attack (TIA), and cerebral infarction without residual deficits: Secondary | ICD-10-CM | POA: Diagnosis not present

## 2020-04-29 DIAGNOSIS — E1169 Type 2 diabetes mellitus with other specified complication: Secondary | ICD-10-CM | POA: Diagnosis not present

## 2020-04-29 DIAGNOSIS — Z0000 Encounter for general adult medical examination without abnormal findings: Secondary | ICD-10-CM | POA: Diagnosis not present

## 2020-06-13 ENCOUNTER — Other Ambulatory Visit: Payer: Self-pay | Admitting: *Deleted

## 2020-06-13 DIAGNOSIS — F331 Major depressive disorder, recurrent, moderate: Secondary | ICD-10-CM

## 2020-06-13 MED ORDER — FLUOXETINE HCL 40 MG PO CAPS: 40.0000 mg | ORAL_CAPSULE | Freq: Every day | ORAL | 0 refills | Status: AC

## 2020-06-13 NOTE — Telephone Encounter (Signed)
Refilling medication.  Please schedule yearly exam to be seen

## 2020-06-20 NOTE — Telephone Encounter (Signed)
Attempted to call pt to schedule yearly with dr Manus Rudd. No answer/ VM full. Will try again later. Ailen Strauch Kennon Holter, CMA

## 2020-07-19 DIAGNOSIS — R972 Elevated prostate specific antigen [PSA]: Secondary | ICD-10-CM | POA: Diagnosis not present

## 2020-08-18 DIAGNOSIS — R972 Elevated prostate specific antigen [PSA]: Secondary | ICD-10-CM | POA: Diagnosis not present

## 2020-09-15 ENCOUNTER — Telehealth: Payer: Self-pay | Admitting: Family Medicine

## 2020-09-15 NOTE — Telephone Encounter (Signed)
..   Medicaid Managed Care   Unsuccessful Outreach Note  09/15/2020 Name: Jacob Underwood MRN: [id number] DOB: [birth year]  Referred by: Delora Fuel, MD Reason for referral : High Risk Managed Medicaid (Attempted to reach this patient today to get him scheduled with the MMM Team. His VM was full and I was not able to leave a message. )   An unsuccessful telephone outreach was attempted today. The patient was referred to the case management team for assistance with care management and care coordination.   Follow Up Plan: The care management team will reach out to the patient again over the next 7-14 days.   [place]

## 2020-09-28 ENCOUNTER — Telehealth: Payer: Self-pay | Admitting: Family Medicine

## 2020-09-28 NOTE — Telephone Encounter (Signed)
..   Medicaid Managed Care   Unsuccessful Outreach Note  09/28/2020 Name: Jacob Underwood MRN: [id number] DOB: 02-17-1961  Referred by: Delora Fuel, MD Reason for referral : High Risk Managed Medicaid (I called this patient today to offer him a phone visit with the [facility] Team. I left my name and number on his VM.)   A second unsuccessful telephone outreach was attempted today. The patient was referred to the case management team for assistance with care management and care coordination.   Follow Up Plan: The care management team will reach out to the patient again over the next 7-14 days.   [place]

## 2020-10-21 DIAGNOSIS — N411 Chronic prostatitis: Secondary | ICD-10-CM | POA: Diagnosis not present

## 2020-10-21 DIAGNOSIS — R972 Elevated prostate specific antigen [PSA]: Secondary | ICD-10-CM | POA: Diagnosis not present

## 2020-10-28 DIAGNOSIS — Z8673 Personal history of transient ischemic attack (TIA), and cerebral infarction without residual deficits: Secondary | ICD-10-CM | POA: Diagnosis not present

## 2020-10-28 DIAGNOSIS — Z6833 Body mass index (BMI) 33.0-33.9, adult: Secondary | ICD-10-CM | POA: Diagnosis not present

## 2020-10-28 DIAGNOSIS — E1169 Type 2 diabetes mellitus with other specified complication: Secondary | ICD-10-CM | POA: Diagnosis not present

## 2020-10-28 DIAGNOSIS — I10 Essential (primary) hypertension: Secondary | ICD-10-CM | POA: Diagnosis not present

## 2020-10-28 DIAGNOSIS — E785 Hyperlipidemia, unspecified: Secondary | ICD-10-CM | POA: Diagnosis not present

## 2020-10-28 DIAGNOSIS — E6609 Other obesity due to excess calories: Secondary | ICD-10-CM | POA: Diagnosis not present

## 2020-11-15 ENCOUNTER — Telehealth: Payer: Self-pay | Admitting: Family Medicine

## 2020-11-15 NOTE — Telephone Encounter (Signed)
..   Medicaid Managed Care   Unsuccessful Outreach Note  11/15/2020 Name: Jacob Underwood MRN: [id number] DOB: 01-21-1961  Referred by: Gifford Shave, MD Reason for referral : High Risk Managed Medicaid (I called the patient today to get him scheduled with the [facility] Team. I left a message on his VM.)   Third unsuccessful telephone outreach was attempted today. The patient was referred to the case management team for assistance with care management and care coordination. The patient's primary care provider has been notified of our unsuccessful attempts to make or maintain contact with the patient. The care management team is pleased to engage with this patient at any time in the future should he/she be interested in assistance from the care management team.   Follow Up Plan: We have been unable to make contact with the patient for follow up. The care management team is available to follow up with the patient after provider conversation with the patient regarding recommendation for care management engagement and subsequent re-referral to the care management team.   [place], [place]

## 2021-03-15 DIAGNOSIS — Z6833 Body mass index (BMI) 33.0-33.9, adult: Secondary | ICD-10-CM | POA: Diagnosis not present

## 2021-03-15 DIAGNOSIS — I10 Essential (primary) hypertension: Secondary | ICD-10-CM | POA: Diagnosis not present

## 2021-03-15 DIAGNOSIS — R5383 Other fatigue: Secondary | ICD-10-CM | POA: Diagnosis not present

## 2021-03-15 DIAGNOSIS — G4700 Insomnia, unspecified: Secondary | ICD-10-CM | POA: Diagnosis not present

## 2021-03-15 DIAGNOSIS — E1165 Type 2 diabetes mellitus with hyperglycemia: Secondary | ICD-10-CM | POA: Diagnosis not present

## 2021-03-15 DIAGNOSIS — F330 Major depressive disorder, recurrent, mild: Secondary | ICD-10-CM | POA: Diagnosis not present

## 2021-03-15 DIAGNOSIS — E6609 Other obesity due to excess calories: Secondary | ICD-10-CM | POA: Diagnosis not present

## 2021-03-15 DIAGNOSIS — E785 Hyperlipidemia, unspecified: Secondary | ICD-10-CM | POA: Diagnosis not present

## 2021-03-15 DIAGNOSIS — E1169 Type 2 diabetes mellitus with other specified complication: Secondary | ICD-10-CM | POA: Diagnosis not present

## 2021-06-11 DIAGNOSIS — F411 Generalized anxiety disorder: Secondary | ICD-10-CM | POA: Diagnosis not present

## 2021-06-11 DIAGNOSIS — F321 Major depressive disorder, single episode, moderate: Secondary | ICD-10-CM | POA: Diagnosis not present

## 2021-06-23 DIAGNOSIS — F321 Major depressive disorder, single episode, moderate: Secondary | ICD-10-CM | POA: Diagnosis not present

## 2021-06-23 DIAGNOSIS — F411 Generalized anxiety disorder: Secondary | ICD-10-CM | POA: Diagnosis not present

## 2021-07-18 DIAGNOSIS — F321 Major depressive disorder, single episode, moderate: Secondary | ICD-10-CM | POA: Diagnosis not present

## 2021-07-18 DIAGNOSIS — F411 Generalized anxiety disorder: Secondary | ICD-10-CM | POA: Diagnosis not present

## 2021-08-01 ENCOUNTER — Encounter: Payer: Self-pay | Admitting: *Deleted

## 2021-08-08 DIAGNOSIS — Z6833 Body mass index (BMI) 33.0-33.9, adult: Secondary | ICD-10-CM | POA: Diagnosis not present

## 2021-08-08 DIAGNOSIS — E1169 Type 2 diabetes mellitus with other specified complication: Secondary | ICD-10-CM | POA: Diagnosis not present

## 2021-08-08 DIAGNOSIS — Z8673 Personal history of transient ischemic attack (TIA), and cerebral infarction without residual deficits: Secondary | ICD-10-CM | POA: Diagnosis not present

## 2021-08-08 DIAGNOSIS — E559 Vitamin D deficiency, unspecified: Secondary | ICD-10-CM | POA: Diagnosis not present

## 2021-08-08 DIAGNOSIS — E1165 Type 2 diabetes mellitus with hyperglycemia: Secondary | ICD-10-CM | POA: Diagnosis not present

## 2021-08-08 DIAGNOSIS — E785 Hyperlipidemia, unspecified: Secondary | ICD-10-CM | POA: Diagnosis not present

## 2021-08-08 DIAGNOSIS — E6609 Other obesity due to excess calories: Secondary | ICD-10-CM | POA: Diagnosis not present

## 2021-08-08 NOTE — Progress Notes (Signed)
Formatting of this note is different from the original.  Images from the original note were not included.  Subjective     Patient ID:  Frederick Garcia is a 61 y.o. (DOB February 23, 1961) male.     Chief Complaint   Patient presents with   ? Hypertension     F/up-hasn't ate since 9am.    ? Hyperlipidemia   ? Diabetes       HPI     Pt presents for follow-up on DM type 2, hyperlipidemia and HTN.  He is taking metformin 1000mg  bid, farxiga 10mg  daily and using ozempic 2mg  weekly.  Glucoses have been in the 120 range.  Denies polydipsia, polyuria and polyphagia.  He also denies chest pain, SOB, HA's and dizziness.  Last eye exam was several years ago.  Energy levels have improved with taking vitamin d and he also recently saw psych who added bupropion to the fluoxetine and he feels that this has also helped.    Feeling well today.    Problem List, Past Medical History, Past Surgical History, Past Family History, Social History, Medications, and Allergies, were reviewed and updated as appropriate.     Review of Systems  Review of Systems   Constitutional: Negative.  Negative for activity change, chills, fatigue and fever.   HENT: Negative.    Eyes: Negative.    Respiratory: Negative.  Negative for cough, chest tightness, shortness of breath and wheezing.    Cardiovascular: Negative.  Negative for chest pain, palpitations and leg swelling.   Gastrointestinal: Negative.  Negative for abdominal pain, diarrhea, nausea and vomiting.   Endocrine: Negative.  Negative for polydipsia, polyphagia and polyuria.   Genitourinary: Negative.  Negative for dysuria, frequency and urgency.   Skin: Negative.  Negative for rash and wound.   Allergic/Immunologic: Negative.    Neurological: Negative.  Negative for dizziness, syncope, weakness, light-headedness, numbness and headaches.   Hematological: Negative.    Psychiatric/Behavioral: Negative.  Negative for dysphoric mood.     Objective     BP 108/62 (BP Location: Left arm, Patient Position: Sitting)    Pulse 83   Temp 97.2 F (36.2 C) (Skin)   Resp 18   Ht 5\' 11"  (1.803 m)   Wt 240 lb 6.4 oz (109 kg)   SpO2 95%   BMI 33.53 kg/m     Physical Exam  Vitals and nursing note reviewed.   Constitutional:       General: He is not in acute distress.     Appearance: Normal appearance. He is well-developed. He is not ill-appearing or toxic-appearing.   HENT:      Head: Normocephalic and atraumatic.   Neck:      Vascular: Normal carotid pulses. No carotid bruit or JVD.   Cardiovascular:      Rate and Rhythm: Normal rate and regular rhythm.      Heart sounds: Normal heart sounds, S1 normal and S2 normal. No murmur heard.     No friction rub. No gallop.   Musculoskeletal:      Right lower leg: No edema.      Left lower leg: No edema.   Pulmonary:      Effort: Pulmonary effort is normal.      Breath sounds: Normal breath sounds. No wheezing or rales.   Skin:     General: Skin is warm and dry.   Neurological:      General: No focal deficit present.      Mental Status: He is alert  and oriented to person, place, and time.   Psychiatric:         Mood and Affect: Mood normal.         Behavior: Behavior normal.         Thought Content: Thought content normal.         Judgment: Judgment normal.     ---------------------------  Labs last 12 hours  No results found for this or any previous visit (from the past 12 hour(s)).      Impression     1. Type 2 diabetes mellitus with hyperlipidemia (*)  Comprehensive metabolic panel    Lipid Panel With LDL/HDL Ratio    Hemoglobin A1c    Hemoglobin A1c    Lipid Panel With LDL/HDL Ratio    Comprehensive metabolic panel     2. Type 2 diabetes mellitus with hyperglycemia, without long-term current use of insulin (*)  Comprehensive metabolic panel    Hemoglobin A1c    Hemoglobin A1c    Comprehensive metabolic panel     3. History of CVA (cerebrovascular accident)  Lipid Panel With LDL/HDL Ratio    Lipid Panel With LDL/HDL Ratio     4. Class 1 obesity due to excess calories with serious  comorbidity and body mass index (BMI) of 33.0 to 33.9 in adult  Comprehensive metabolic panel    Comprehensive metabolic panel     5. Vitamin D deficiency  Vitamin D 25 Hydroxy    Vitamin D 25 Hydroxy         Plan   Reassurance.    DM TYPE 2: Hopefully improving. Last A1C 8.7. Recheck A1C and CMP.  Continue farxiga 10mg  daily, metformin 1000mg  bid and ozempic 2mg  weekly - will adjust if indicated.  Continue monitoring glucoses regularly.  Encourage healthy diet and regular exercise.  Also recommend eye exam.    HYPERLIPIDEMIA: Hopefully improving - check lipids.  Continue lovastatin 40mg  daily.  Encourage healthy diet and regular exercise.    HTN: Stable and controlled. Continue lisinopril 10mg  daily.  Limit sodium intake.    VIT D DEF: Hopefully improving - check level.  Continue regular vitamin d supplementation.    BMI Screening and Intervention    Suggested Diagnosis based on BMI:     Obese (BMI 30 to 34.9): Yes       Dietary Intervention: DASH diet, Low carbohydrate diet, Increase fruits and vegetables, Avoid sweetened drinks, Eliminate fast food, Portion control, Reduce caloric intake  Activity Recommendation: Increased activity, 30 min/day, 5 days per week  Referrals:              See encounter after visit summary for patient specific instructions.    I have reviewed the information contained in this note and personally verified its accuracy.  I obtained the history of present illness and personally performed the physical exam.    Patient verbalized to me that they understood what their problem is, what they need to do about it and why it is important that they do it.    Follow up in about 6 months (around 02/07/2022) for CPE- fasting..    Orders Placed This Encounter   Procedures   ? Comprehensive metabolic panel   ? Lipid Panel With LDL/HDL Ratio   ? Hemoglobin A1c   ? Vitamin D 25 Hydroxy       Patient's Medications        Accurate as of August 08, 2021  3:49 PM. Reflects encounter med changes as of last  refresh         Continued Medications      Instructions   buPROPion hcl 300 mg 24 hr tablet  Commonly known as: WELLBUTRIN XL   300 mg, Oral, Daily    ergocalciferol 1.25 MG (50000 UT) capsule  Commonly known as: ERGOCALCIFEROL   Take 1 capsule twice weekly.    FARXIGA 10 mg tablet  Generic drug: dapagliflozin   TAKE 1 TABLET(10 MG) BY MOUTH DAILY    fluoxetine 40 MG capsule  Commonly known as: PROZAC   TAKE 1 CAPSULE(40 MG) BY MOUTH DAILY    hydrOXYzine HCl 25 mg tablet  Commonly known as: ATARAX   Take 1-2 tablets at bedtime as needed for sleep.    lisinopril 10 mg tablet  Commonly known as: PRINIVIL,ZESTRIL   TAKE 1 TABLET(10 MG) BY MOUTH DAILY    lovastatin 40 mg tablet  Commonly known as: MEVACOR   40 mg, Oral, Daily    metformin 1000 MG tablet  Commonly known as: GLUCOPHAGE   TAKE 1 TABLET(1000 MG) BY MOUTH TWICE DAILY WITH MEALS    NOVOFINE PLUS PEN NEEDLE 32G X 4 MM Misc  Generic drug: Insulin Pen Needle   Use to inject victoza daily.    OZEMPIC (0.25 OR 0.5 MG/DOSE) 2 MG/3ML Sopn  Generic drug: Semaglutide(0.25 or 0.5MG /DOS)   0.5 mg, Subcutaneous, Every 7 days        Risks, benefits, and alternatives of the medications and treatment plan prescribed today were discussed, and patient expressed understanding. Plan follow-up as discussed or as needed if any worsening symptoms or change in condition.      A yearly health maintenance exam was recommended where appropriate.        Electronically signed by Molli Knock, PA-C at 08/08/2021  3:49 PM EDT

## 2021-09-01 DIAGNOSIS — F900 Attention-deficit hyperactivity disorder, predominantly inattentive type: Secondary | ICD-10-CM | POA: Diagnosis not present

## 2021-09-01 DIAGNOSIS — F411 Generalized anxiety disorder: Secondary | ICD-10-CM | POA: Diagnosis not present

## 2021-09-01 DIAGNOSIS — F321 Major depressive disorder, single episode, moderate: Secondary | ICD-10-CM | POA: Diagnosis not present

## 2022-01-11 ENCOUNTER — Encounter

## 2022-01-11 ENCOUNTER — Ambulatory Visit: Admit: 2022-01-11 | Discharge: 2022-01-11 | Payer: PRIVATE HEALTH INSURANCE | Attending: Family | Primary: Family

## 2022-01-11 DIAGNOSIS — E119 Type 2 diabetes mellitus without complications: Secondary | ICD-10-CM

## 2022-01-11 NOTE — Progress Notes (Signed)
I have reviewed all needed documentation in preparation for visit. Verified patient by name and date of birth  Chief Complaint   Patient presents with    New Patient     Mini stroke-not fasting- no concerns        Vitals:    01/11/22 1103   BP: 113/73   Site: Right Upper Arm   Position: Sitting   Cuff Size: Medium Adult   Pulse: 80   Resp: 20   Temp: 97 F (36.1 C)   TempSrc: Temporal   SpO2: 97%   Weight: 104.8 kg (231 lb)   Height: 1.829 m (6')       Health Maintenance Due   Topic Date Due    Hepatitis B vaccine (1 of 3 - 3-dose series) Never done    COVID-19 Vaccine (1) Never done    Lipids  Never done    Depression Monitoring  Never done    HIV screen  Never done    Hepatitis C screen  Never done    DTaP/Tdap/Td vaccine (1 - Tdap) Never done    Colorectal Cancer Screen  Never done    Shingles vaccine (1 of 2) Never done    Flu vaccine (1) Never done       1. "Have you been to the ER, urgent care clinic since your last visit?  Hospitalized since your last visit?" No    2. "Have you seen or consulted any other health care providers outside of the [facility] since your last visit?" No     3. For patients aged 62-75: Has the patient had a colonoscopy / FIT/ Cologuard? Yes - no Care Gap present      If the patient is male:    4. For patients aged 3-74: Has the patient had a mammogram within the past 2 years? NA - based on age or sex      35. For patients aged 21-65: Has the patient had a pap smear? NA - based on age or sex        Aundria Rud, [place]
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
## 2022-01-13 LAB — COMPREHENSIVE METABOLIC PANEL
ALT: 17 IU/L (ref 0–44)
AST: 15 IU/L (ref 0–40)
Albumin/Globulin Ratio: 1.4 (ref 1.2–2.2)
Albumin: 4.1 g/dL (ref 3.8–4.9)
Alkaline Phosphatase: 66 IU/L (ref 44–121)
BUN/Creatinine Ratio: 10 (ref 9–20)
BUN: 10 mg/dL (ref 6–24)
CO2: 24 mmol/L (ref 20–29)
Calcium: 9.1 mg/dL (ref 8.7–10.2)
Chloride: 104 mmol/L (ref 96–106)
Creatinine: 1.05 mg/dL (ref 0.76–1.27)
Est, Glomerular Filtration Rate: 82 mL/min/{1.73_m2} (ref >59)
Globulin, Total: 2.9 g/dL (ref 1.5–4.5)
Glucose: 161 mg/dL — ABNORMAL HIGH (ref 70–99)
Potassium: 4.6 mmol/L (ref 3.5–5.2)
Sodium: 142 mmol/L (ref 134–144)
Total Bilirubin: 0.3 mg/dL (ref 0.0–1.2)
Total Protein: 7 g/dL (ref 6.0–8.5)

## 2022-01-13 LAB — CBC WITH AUTO DIFFERENTIAL
Basophils %: 1 %
Basophils Absolute: 0 10*3/uL (ref 0.0–0.2)
Eosinophils %: 2 %
Eosinophils Absolute: 0.1 10*3/uL (ref 0.0–0.4)
Hematocrit: 45.5 % (ref 37.5–51.0)
Hemoglobin: 14.9 g/dL (ref 13.0–17.7)
Immature Grans (Abs): 0 10*3/uL (ref 0.0–0.1)
Immature Granulocytes: 0 %
Lymphocytes %: 29 %
Lymphocytes Absolute: 1.6 10*3/uL (ref 0.7–3.1)
MCH: 30.1 pg (ref 26.6–33.0)
MCHC: 32.7 g/dL (ref 31.5–35.7)
MCV: 92 fL (ref 79–97)
Monocytes %: 9 %
Monocytes Absolute: 0.5 10*3/uL (ref 0.1–0.9)
Neutrophils %: 59 %
Neutrophils Absolute: 3.2 10*3/uL (ref 1.4–7.0)
Platelets: 255 10*3/uL (ref 150–450)
RBC: 4.95 x10E6/uL (ref 4.14–5.80)
RDW: 13.4 % (ref 11.6–15.4)
WBC: 5.4 10*3/uL (ref 3.4–10.8)

## 2022-01-13 LAB — HIV 1/2 AG/AB, 4TH GENERATION,W RFLX CONFIRM: HIV Screen 4th Generation wRfx: NONREACTIVE

## 2022-01-13 LAB — LIPID PANEL
Cholesterol: 155 mg/dL (ref 100–199)
HDL: 37 mg/dL — ABNORMAL LOW (ref >39)
LDL Calculated: 103 mg/dL — ABNORMAL HIGH (ref 0–99)
Triglycerides: 75 mg/dL (ref 0–149)
VLDL Cholesterol Calculated: 15 mg/dL (ref 5–40)

## 2022-01-13 LAB — HEPATITIS C ANTIBODY: Hepatitis C Ab: NONREACTIVE

## 2022-01-15 LAB — CARDIOVASCULAR REPORT

## 2022-01-15 LAB — HEMOGLOBIN A1C: Hemoglobin A1C: 8.1 % — ABNORMAL HIGH (ref 4.8–5.6)

## 2022-01-15 NOTE — Telephone Encounter (Signed)
Placed call to patient left message to return call at earliest convenience regarding lab results.     Benedict Kue, CCMA

## 2022-01-15 NOTE — Telephone Encounter (Signed)
-----   Message from Humphrey Rolls, [place] sent at 01/14/2022 12:35 PM EST -----  Please call patient. Labs are back. All normal. If fasting/ blood sugar was elevated.  If not fasting/ monitor.  HA1C is still pending.Will address when resulted.  Sharyn Lull

## 2022-01-19 ENCOUNTER — Telehealth

## 2022-01-19 NOTE — Telephone Encounter (Signed)
Please inform pt that his A1c shows that he has diabetes. Please help him schedule an appointment with his PCP.

## 2022-01-19 NOTE — Telephone Encounter (Signed)
-----   Message from Brien Mates sent at 01/16/2022  9:01 AM EST -----  Please advise, A1c is complete  ----- Message -----  From: Humphrey Rolls, FNP  Sent: 01/14/2022  12:35 PM EST  To: [facility] Clinical Staff    Please call patient. Labs are back. All normal. If fasting/ blood sugar was elevated.  If not fasting/ monitor.  HA1C is still pending.Will address when resulted.  Sharyn Lull

## 2022-01-19 NOTE — Telephone Encounter (Signed)
Placed call to patient left message to return call at earliest convenience in regards to his lab results and an in-office visit will be required.

## 2022-01-20 ENCOUNTER — Encounter

## 2022-03-28 NOTE — Telephone Encounter (Signed)
Requesting NP evaluation.    Please contact

## 2022-03-29 NOTE — Telephone Encounter (Signed)
Returned call and left a message to call back to schedule an appt

## 2022-05-04 NOTE — Telephone Encounter (Signed)
Patient is returning a call from our office to schedule with Dr. Elby Beck as he was referred to her.

## 2022-05-07 NOTE — Telephone Encounter (Signed)
Patient returning phone call.

## 2022-05-07 NOTE — Telephone Encounter (Signed)
Lvm requesting call back.

## 2022-05-08 NOTE — Telephone Encounter (Signed)
Returned call, lvm requesting call back.

## 2022-07-09 ENCOUNTER — Ambulatory Visit: Admit: 2022-07-09 | Discharge: 2022-07-09 | Payer: MEDICAID | Attending: Family | Primary: Family

## 2022-07-09 DIAGNOSIS — I10 Essential (primary) hypertension: Secondary | ICD-10-CM

## 2022-07-09 MED ORDER — SILDENAFIL CITRATE 20 MG PO TABS
20 MG | ORAL_TABLET | ORAL | 11 refills | Status: DC | PRN
Start: 2022-07-09 — End: 2023-04-11

## 2022-07-09 MED ORDER — TRULICITY 0.75 MG/0.5ML SC SOPN
0.75 | SUBCUTANEOUS | 2 refills | 28.00000 days | Status: DC
Start: 2022-07-09 — End: 2023-10-01

## 2022-07-09 NOTE — Progress Notes (Signed)
I have reviewed all needed documentation in preparation for visit. Verified patient by name and date of birth    Chief Complaint   Patient presents with    Follow-up     Wants to discuss Rx       "Have you been to the ER, urgent care clinic since your last visit?  Hospitalized since your last visit?"    NO    "Have you seen or consulted any other health care providers outside of [facility] since your last visit?"    NO      "Have you had a colorectal cancer screening such as a colonoscopy/FIT/Cologuard?    Yes, 2 yrs ago  [facility]     No colonoscopy on file  No cologuard on file  No FIT/FOBT on file   No flexible sigmoidoscopy on file         Click Here for Release of Records Request    There were no vitals filed for this visit.    Health Maintenance Due   Topic Date Due    COVID-19 Vaccine (1) Never done    Pneumococcal 0-64 years Vaccine (1 of 2 - PCV) Never done    Diabetic foot exam  Never done    Diabetic Alb to Cr ratio (uACR) test  Never done    Diabetic retinal exam  Never done    DTaP/Tdap/Td vaccine (1 - Tdap) Never done    Colorectal Cancer Screen  Never done    Shingles vaccine (1 of 2) Never done    Respiratory Syncytial Virus (RSV) Pregnant or age 88 yrs+ (1 - 1-dose 60+ series) Never done       Pecolia Ades, LPN
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
## 2022-11-01 NOTE — Telephone Encounter (Signed)
 Call placed to patient.  2 identifiers received.  Advised patient that he is seeing NP [facility] tomorrow not Dr. Delynn.  Just wanted patient to be aware that neuropysch testing would not be done tomorrow.  Patient indicated understanding and stated he wanted to keep appointment with NP [facility].

## 2022-11-02 ENCOUNTER — Ambulatory Visit: Admit: 2022-11-02 | Discharge: 2022-11-13 | Payer: MEDICAID | Primary: Family

## 2022-11-02 VITALS — BP 142/68 | HR 65 | Resp 16 | Ht 72.0 in | Wt 226.0 lb

## 2022-11-02 DIAGNOSIS — R413 Other amnesia: Secondary | ICD-10-CM

## 2022-11-02 NOTE — Telephone Encounter (Signed)
 Hi Mary.  Patient needs to be scheduled with Dr. Wynonia Hazard.  Thanks
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
## 2022-11-08 NOTE — Telephone Encounter (Signed)
 Request for Medical Records faxed to [facility].  Confirmation received.

## 2022-11-13 NOTE — Telephone Encounter (Signed)
 Attempted to call to schedule. Vm full, unable to lvm.

## 2022-11-17 ENCOUNTER — Inpatient Hospital Stay: Admit: 2022-11-17 | Payer: PRIVATE HEALTH INSURANCE | Primary: Family

## 2022-11-17 DIAGNOSIS — R413 Other amnesia: Secondary | ICD-10-CM

## 2022-11-17 MED ORDER — GADOTERIDOL 279.3 MG/ML IV SOLN
279.3 | Freq: Once | INTRAVENOUS | Status: AC | PRN
Start: 2022-11-17 — End: 2022-11-17
  Administered 2022-11-17: 14:00:00 20 mL via INTRAVENOUS

## 2022-11-17 MED FILL — PROHANCE 279.3 MG/ML IV SOLN: 279.3 MG/ML | INTRAVENOUS | Qty: 20

## 2022-11-21 NOTE — Telephone Encounter (Signed)
 Call placed to patient.  Unable to LVM due to mailbox being full.

## 2022-11-22 NOTE — Telephone Encounter (Signed)
 Patient returning nurse call. Requesting to discuss test results. Please contact

## 2022-11-22 NOTE — Telephone Encounter (Signed)
 Call placed to patient.  Unable to LVM due to mailbox being full.

## 2022-11-26 ENCOUNTER — Ambulatory Visit: Admit: 2022-11-26 | Discharge: 2022-11-26 | Payer: PRIVATE HEALTH INSURANCE | Attending: Family | Primary: Family

## 2022-11-26 ENCOUNTER — Encounter: Payer: Medicaid (Managed Care) | Primary: Family

## 2022-11-26 DIAGNOSIS — E119 Type 2 diabetes mellitus without complications: Secondary | ICD-10-CM

## 2022-11-26 NOTE — Telephone Encounter (Signed)
 Call placed to patient.  2 identifiers received.  Advised per NP [place] that results of MRI were normal.  Also advised patient that he did not need the appointment today.  He needs to be seen after he has completed neuro psych testing.

## 2022-11-26 NOTE — Telephone Encounter (Signed)
 Good morning.  Patient needs to be scheduled for neuropsych testing.  Thanks so much.
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
## 2022-11-26 NOTE — Progress Notes (Signed)
 I have reviewed all needed documentation in preparation for visit. Verified patient by name and date of birth    Chief Complaint   Patient presents with    6 Month Follow-Up       Have you been to the ER, urgent care clinic since your last visit?  Hospitalized since your last visit?    NO    "Have you seen or consulted any other health care providers outside our system since your last visit?"    NO    "Have you had a colorectal cancer screening such as a colonoscopy/FIT/Cologuard?    NO    No colonoscopy on file  No cologuard on file  No FIT/FOBT on file   No flexible sigmoidoscopy on file     "Have you had a diabetic eye exam?"    NO     No diabetic eye exam on file            Vitals:    11/26/22 0857   BP: (!) 149/85   Site: Left Upper Arm   Position: Sitting   Cuff Size: Medium Adult   Pulse: 78   Resp: 18   Temp: 97.3 F (36.3 C)   TempSrc: Temporal   SpO2: 95%   Weight: 105.9 kg (233 lb 6.4 oz)       Health Maintenance Due   Topic Date Due    Pneumococcal 0-64 years Vaccine (1 of 2 - PCV) Never done    Diabetic foot exam  Never done    Diabetic Alb to Cr ratio (uACR) test  Never done    Diabetic retinal exam  Never done    DTaP/Tdap/Td vaccine (1 - Tdap) Never done    Colorectal Cancer Screen  Never done    Shingles vaccine (1 of 2) Never done    Respiratory Syncytial Virus (RSV) Pregnant or age 34 yrs+ (1 - 1-dose 60+ series) Never done    Flu vaccine (1) Never done    COVID-19 Vaccine (1 - 2023-24 season) Never done       Dawna Axe, LPN

## 2022-11-26 NOTE — Progress Notes (Deleted)
 No chief complaint on file.      HPI    Frederick Garcia is a 62 year old male here for follow up. I saw him last on 11/02/22 as a NP for memory concerns.               Review of Systems      Past Medical History:   Diagnosis Date   . Cerebrovascular disease    . Depression    . Hyperlipidemia    . Hypertension    . Type 2 diabetes mellitus without complication (HCC)      Family History   Problem Relation Age of Onset   . Dementia Mother    . Scoliosis Father      Social History     Socioeconomic History   . Marital status: Married     Spouse name: Not on file   . Number of children: Not on file   . Years of education: Not on file   . Highest education level: Not on file   Occupational History   . Not on file   Tobacco Use   . Smoking status: Never   . Smokeless tobacco: Never   Vaping Use   . Vaping status: Never Used   Substance and Sexual Activity   . Alcohol use: Yes     Alcohol/week: 4.0 standard drinks of alcohol     Types: 4 Cans of beer per week   . Drug use: Never   . Sexual activity: Not Currently   Other Topics Concern   . Not on file   Social History Narrative   . Not on file     Social Determinants of Health     Financial Resource Strain: Low Risk  (01/11/2022)    Overall Financial Resource Strain (CARDIA)    . Difficulty of Paying Living Expenses: Not hard at all   Food Insecurity: Not on file (01/11/2022)   Transportation Needs: Unknown (01/11/2022)    PRAPARE - Transportation    . Lack of Transportation (Medical): Not on file    . Lack of Transportation (Non-Medical): No   Physical Activity: Sufficiently Active (01/10/2022)    Exercise Vital Sign    . Days of Exercise per Week: 3 days    . Minutes of Exercise per Session: 50 min   Stress: Not on file   Social Connections: Not on file   Intimate Partner Violence: Not At Risk (01/10/2022)    Humiliation, Afraid, Rape, and Kick questionnaire    . Fear of Current or Ex-Partner: No    . Emotionally Abused: No    . Physically Abused: No    . Sexually Abused: No    Housing Stability: Unknown (01/11/2022)    Housing Stability Vital Sign    . Unable to Pay for Housing in the Last Year: Not on file    . Number of Places Lived in the Last Year: Not on file    . Unstable Housing in the Last Year: No     Allergies   Allergen Reactions   . Dilantin [Phenytoin]      ? Elspeth louder         Current Outpatient Medications   Medication Sig   . Dexmethylphenidate HCl (FOCALIN PO) Take by mouth daily   . dulaglutide  (TRULICITY ) 0.75 MG/0.5ML SOPN SC injection Inject 0.5 mLs into the skin once a week   . sildenafil  (REVATIO ) 20 MG tablet Take 1-5 tablets by mouth as needed (erectile dysfunction) *  Pt wishes to use GoodRx.com coupon instead of his insurance for this drug.   . metFORMIN  (GLUCOPHAGE ) 1000 MG tablet Take 1 tablet by mouth as needed   . lisinopril  (PRINIVIL ;ZESTRIL ) 10 MG tablet Take 1 tablet by mouth daily   . lovastatin (MEVACOR) 40 MG tablet as needed   . vitamin D  (ERGOCALCIFEROL ) 1.25 MG (50000 UT) CAPS capsule Take by mouth daily   . FARXIGA  10 MG tablet Take 1 tablet by mouth daily   . buPROPion (WELLBUTRIN XL) 300 MG extended release tablet Take 1 tablet by mouth daily   . FLUoxetine (PROZAC) 40 MG capsule Take 1 capsule by mouth daily   . hydrOXYzine HCl (ATARAX) 25 MG tablet Take 1 tablet by mouth as needed     No current facility-administered medications for this visit.           Neurologic Exam    There were no vitals taken for this visit.      CT Results (maximum last 3):  CT Result (most recent):  No results found for this or any previous visit from the past 3650 days.        MRI Results (maximum last 3):  MRI Result (most recent):  MRI BRAIN W WO CONTRAST 11/17/2022    Narrative  EXAM:  MRI BRAIN W WO CONTRAST    Clinical history: Other amnesia; Personal history of transient ischemic attack  (TIA), and cerebral infarction without residual deficits; Other specified  postprocedural states; Personal history of other mental and behavioral disorders  INDICATION:    Other amnesia; Personal history of transient ischemic attack  (TIA), and cerebral infarction without residual deficits; Other specified  postprocedural states; Personal history of other mental and behavioral disorders    COMPARISON: None    TECHNIQUE: Frederick examination of the brain includes axial and sagittal T1 , axial  T2, axial FLAIR, axial gradient echo, axial DWI, coronal T1 . Pre and post  contrast axial T1-weighted imaging. Postcontrast T1-weighted imaging coronal  plane.    CONTRAST: ProHance   FINDINGS:  There is no intracranial mass, hemorrhage or acute infarction.  IACs are symmetric. Left vertebral artery is dominant. There are posterior  communicating arteries bilaterally.    Otherwise;  There is no abnormal parenchymal enhancement. There is no abnormal meningeal  enhancement demonstrated. The brain architecture is normal. There is no evidence  of midline shift or mass-effect. The ventricles are normal in size, position and  configuration.  There are no extra-axial fluid collections. Major intracranial  vascular flow-voids are unremarkable. The orbits are grossly symmetric. Dural  venous sinuses are grossly unremarkable. There is no Chiari or syrinx. Pituitary  and infundibulum grossly unremarkable. Cerebellopontine angles are unremarkable.  No skull base mass is identified. Cavernous sinuses are symmetric. The mastoid  air cells and are well pneumatized and clear.    Impression  Normal MRI of the brain.    No intracranial mass, hemorrhage or evidence of acute infarction.            Electronically signed by JIMMY HABIB            Assessment and Plan   There are no diagnoses linked to this encounter.            Olivia LITTIE Cullens, APRN - NP

## 2022-11-30 NOTE — Telephone Encounter (Signed)
HIPAA Verified (if caller is someone other than patient): N/A       Reason for Call:     If calling to cancel, does patient want to reschedule?   N/A    Is this call related to results?   no    If yes, please let patient know they must wait for their follow up / feedback appointment to discuss.  They can, however, pick up a copy of the results at the clinic 2-3 weeks after their testing appt.     Is this a New Patient Appointment Request?   yes    If yes:   Requested Provider (choose all that apply - patient doesn't need to call ALL locations):   [place]    Referred By:  Gracy Racer     Referral in chart?   yes    Patient's Insurance Carrier (please ensure you gather insurance information):       Additional notes / reason for call:         **Please advise callers that it could take up to 5 business days for a return call**        Message: (any additional details from patient/caller not covered above)          Level 1 Calls - attempted to reach practice? N/A     Reason Call Marked High Priority (if applicable):

## 2022-12-03 NOTE — Telephone Encounter (Signed)
Duplicate encounter, pt scheduled

## 2022-12-03 NOTE — Telephone Encounter (Signed)
Patient returning phone call 

## 2022-12-03 NOTE — Telephone Encounter (Signed)
Left message returning call

## 2022-12-03 NOTE — Telephone Encounter (Signed)
Pt scheduled  

## 2022-12-03 NOTE — Telephone Encounter (Signed)
Patient is calling to be scheduled with either Dr. Neta Mends or Dr. Wynonia Hazard whoever can see him sooner.    Patient states he has been trying to get scheduled for weeks with no success.    Patient is being asked to see neuropsych by NP. Canfield.

## 2022-12-05 ENCOUNTER — Encounter

## 2022-12-06 LAB — ALBUMIN/CREATININE RATIO, URINE
Albumin Urine: 0.9 mg/dL
Albumin/Creatinine Ratio: 8 mg/g (ref 0–30)
Creatinine, Ur: 119 mg/dL

## 2022-12-06 LAB — COMPREHENSIVE METABOLIC PANEL
ALT: 25 U/L (ref 12–78)
AST: 15 U/L (ref 15–37)
Albumin/Globulin Ratio: 1.1 (ref 1.1–2.2)
Albumin: 3.6 g/dL (ref 3.5–5.0)
Alk Phosphatase: 63 U/L (ref 45–117)
Anion Gap: 3 mmol/L (ref 2–12)
BUN/Creatinine Ratio: 11 — ABNORMAL LOW (ref 12–20)
BUN: 10 mg/dL (ref 6–20)
CO2: 31 mmol/L (ref 21–32)
Calcium: 9.2 mg/dL (ref 8.5–10.1)
Chloride: 106 mmol/L (ref 97–108)
Creatinine: 0.88 mg/dL (ref 0.70–1.30)
Est, Glom Filt Rate: >90 mL/min/{1.73_m2} (ref >60)
Globulin: 3.3 g/dL (ref 2.0–4.0)
Glucose: 135 mg/dL — ABNORMAL HIGH (ref 65–100)
Potassium: 4.3 mmol/L (ref 3.5–5.1)
Sodium: 140 mmol/L (ref 136–145)
Total Bilirubin: 0.3 mg/dL (ref 0.2–1.0)
Total Protein: 6.9 g/dL (ref 6.4–8.2)

## 2022-12-06 LAB — HEMOGLOBIN A1C
Estimated Avg Glucose: 148 mg/dL
Hemoglobin A1C: 6.8 % — ABNORMAL HIGH (ref 4.0–5.6)

## 2022-12-06 LAB — LIPID PANEL
Chol/HDL Ratio: 4.4 (ref 0.0–5.0)
Cholesterol, Total: 201 mg/dL — ABNORMAL HIGH (ref <200)
HDL: 46 mg/dL
LDL Cholesterol: 117.6 mg/dL — ABNORMAL HIGH (ref 0–100)
Triglycerides: 187 mg/dL — ABNORMAL HIGH (ref <150)
VLDL Cholesterol Calculated: 37.4 mg/dL

## 2022-12-06 LAB — URINE CULTURE HOLD SAMPLE

## 2022-12-20 ENCOUNTER — Encounter
Admit: 2022-12-20 | Discharge: 2022-12-20 | Payer: PRIVATE HEALTH INSURANCE | Attending: Clinical Neuropsychologist | Primary: Family

## 2022-12-20 DIAGNOSIS — G3184 Mild cognitive impairment, so stated: Secondary | ICD-10-CM
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
## 2022-12-21 NOTE — Telephone Encounter (Signed)
PA requested via availity  Reference Number  [id number]

## 2022-12-22 ENCOUNTER — Encounter

## 2022-12-24 MED ORDER — METFORMIN HCL 1000 MG PO TABS
1000 MG | ORAL_TABLET | Freq: Two times a day (BID) | ORAL | 3 refills | Status: AC
Start: 2022-12-24 — End: 2023-03-08

## 2022-12-24 MED ORDER — FREESTYLE LIBRE 3 SENSOR MISC
3 refills | Status: DC
Start: 2022-12-24 — End: 2022-12-26

## 2022-12-26 ENCOUNTER — Encounter

## 2022-12-26 MED ORDER — FREESTYLE LIBRE 3 PLUS SENSOR MISC
3 refills | Status: DC
Start: 2022-12-26 — End: 2023-04-11

## 2023-01-02 NOTE — Telephone Encounter (Signed)
Pt has called to check on status of prior authorization for blood sugar monitor (Free style 3 plus meter). Informed Pt to please allow at least 14 days for this process.

## 2023-01-07 ENCOUNTER — Ambulatory Visit: Admit: 2023-01-07 | Payer: PRIVATE HEALTH INSURANCE | Attending: Clinical Neuropsychologist | Primary: Family

## 2023-01-07 DIAGNOSIS — G3184 Mild cognitive impairment, so stated: Secondary | ICD-10-CM

## 2023-01-09 NOTE — Progress Notes (Signed)
 [facility]  [facility]  NEUROLOGY CLINIC   [address] [place] [address]   [place], [place] [address]   [phone] Office   [phone] Fax      Neuropsychological Evaluation Report    Referral:

## 2023-01-16 NOTE — Telephone Encounter (Signed)
 Initiated auth for [employer]

## 2023-01-18 ENCOUNTER — Telehealth: Admit: 2023-01-18 | Admitting: Clinical Neuropsychologist

## 2023-01-18 DIAGNOSIS — G3184 Mild cognitive impairment, so stated: Secondary | ICD-10-CM

## 2023-01-18 NOTE — Telephone Encounter (Signed)
 Patient would like a call back with recommendations of a psychiatrist he can see.    Patient states he was advised to see one by Dr. Wynonia Hazard.

## 2023-01-18 NOTE — Telephone Encounter (Signed)
 Pt came in confused about what he needed to do next. MT gave him a referral to see neurologist - pt went to the neurologist and now they're saying he needs to see a psychiatry due to medications needed/ and his diagnosis.     - Pt doesn't know what to do k

## 2023-01-22 NOTE — Telephone Encounter (Signed)
Returned call and left a message, will send MyChart message as well.

## 2023-01-28 ENCOUNTER — Encounter: Payer: PRIVATE HEALTH INSURANCE | Attending: Family | Primary: Family

## 2023-02-01 ENCOUNTER — Ambulatory Visit: Admit: 2023-02-01 | Payer: PRIVATE HEALTH INSURANCE | Attending: Family | Admitting: Family | Primary: Family

## 2023-02-01 VITALS — BP 142/87 | HR 75 | Temp 97.30000°F | Resp 18 | Wt 237.4 lb

## 2023-02-01 DIAGNOSIS — F329 Major depressive disorder, single episode, unspecified: Principal | ICD-10-CM

## 2023-02-01 NOTE — Progress Notes (Signed)
 Frederick Garcia is a 62 y.o. male  Chief Complaint   Patient presents with    Follow-up     Neuro condition, PTSD ADHD, Anxiety, BiPolar       Vitals:    02/01/23 0850   BP: (!) 142/87   Pulse: 75   Resp: 18   Temp: 97.3 F (36.3 C)   SpO2: 96%      Wt Reading

## 2023-02-01 NOTE — Telephone Encounter (Signed)
 Colonoscopy records requested via FAX from [facility]  [phone]    Pecolia Ades, LPN

## 2023-02-01 NOTE — Progress Notes (Signed)
 I have reviewed all needed documentation in preparation for visit. Verified patient by name and date of birth    Chief Complaint   Patient presents with    Follow-up     Neuro condition, PTSD ADHD, Anxiety, BiPolar       "Have you been to the ER, urgent ca

## 2023-02-01 NOTE — Patient Instructions (Signed)
 [employer]*  (Call [employer]/211 if need more resources.)    Medical Care  [employer] Assistance  What they offer: The [employer] Program helps uninsured patients who do not qualify for government-

## 2023-02-12 ENCOUNTER — Ambulatory Visit
Admit: 2023-02-12 | Payer: PRIVATE HEALTH INSURANCE | Attending: Clinical Neuropsychologist | Admitting: Clinical Neuropsychologist | Primary: Family

## 2023-02-12 DIAGNOSIS — G3184 Mild cognitive impairment, so stated: Secondary | ICD-10-CM

## 2023-02-12 NOTE — Progress Notes (Signed)
 Chief Complaint:  Follow up to discuss test results with this established patient related to neurocognitive and psychologic functioning, cognitive concerns and emotional/mood concerns as outlined below.     A neuropsychological evaluation was completed w

## 2023-02-14 NOTE — Telephone Encounter (Signed)
 Called patient and wife because we were confused on there message both phone aren't in service right now

## 2023-03-08 ENCOUNTER — Encounter

## 2023-03-08 MED ORDER — METFORMIN HCL 1000 MG PO TABS
1000 | ORAL_TABLET | Freq: Two times a day (BID) | ORAL | 1 refills | Status: AC
Start: 2023-03-08 — End: ?

## 2023-03-08 MED ORDER — FREESTYLE LIBRE 3 SENSOR MISC
3 refills | Status: DC
Start: 2023-03-08 — End: 2023-04-11

## 2023-03-08 MED ORDER — LISINOPRIL 10 MG PO TABS
10 | ORAL_TABLET | Freq: Every day | ORAL | 3 refills | Status: DC
Start: 2023-03-08 — End: 2023-10-01

## 2023-03-08 MED ORDER — FARXIGA 10 MG PO TABS
10 | ORAL_TABLET | Freq: Every day | ORAL | 2 refills | Status: DC
Start: 2023-03-08 — End: 2024-01-27

## 2023-03-08 MED ORDER — VITAMIN D (ERGOCALCIFEROL) 1.25 MG (50000 UT) PO CAPS
1.25 | ORAL_CAPSULE | ORAL | 2 refills | Status: DC
Start: 2023-03-08 — End: 2024-03-05

## 2023-04-10 ENCOUNTER — Encounter

## 2023-04-10 NOTE — Telephone Encounter (Addendum)
 Refill request received from [facility] for   Requested Prescriptions     Pending Prescriptions Disp Refills    Continuous Glucose Sensor (FREESTYLE LIBRE 3 PLUS SENSOR) MISC 6 each 3     Sig: Place 1 Units onto the skin every 14 days    FARXIGA 10 MG tablet 90 tablet 2     Sig: Take 1 tablet by mouth daily    lisinopril (PRINIVIL;ZESTRIL) 10 MG tablet 90 tablet 3     Sig: Take 1 tablet by mouth daily    metFORMIN (GLUCOPHAGE) 1000 MG tablet 180 tablet 1     Sig: Take 1 tablet by mouth 2 times daily (with meals)    sildenafil (REVATIO) 20 MG tablet 30 tablet 11     Sig: Take 1-5 tablets by mouth as needed (erectile dysfunction) *Pt wishes to use GoodRx.com coupon instead of his insurance for this drug.    vitamin D (ERGOCALCIFEROL) 1.25 MG (50000 UT) CAPS capsule 12 capsule 2     Sig: Take 1 capsule by mouth once a week    buPROPion (WELLBUTRIN XL) 300 MG extended release tablet 30 tablet 2     Sig: Take 1 tablet by mouth every morning    dexmethylphenidate (FOCALIN) 5 MG tablet 60 tablet 0     Sig: Take 1 tablet by mouth 2 times daily. Max Daily Amount: 10 mg    FLUoxetine (PROZAC) 40 MG capsule 30 capsule 3     Sig: Take 1 capsule by mouth daily    hydrOXYzine HCl (ATARAX) 25 MG tablet 30 tablet 2     Sig: Take 1 tablet by mouth as needed for Anxiety    lovastatin (MEVACOR) 40 MG tablet 30 tablet 3     Sig: Take 1 tablet by mouth nightly     Last office visit: 02/01/2023   Next office visit: Visit date not found     Routed to Stevie Kern, FNP for review.         Nima Kemppainen Cma

## 2023-04-11 ENCOUNTER — Encounter

## 2023-04-11 MED ORDER — FREESTYLE LIBRE 3 SENSOR MISC
3 refills | Status: DC
Start: 2023-04-11 — End: 2023-10-01

## 2023-04-11 MED ORDER — FREESTYLE LIBRE 3 PLUS SENSOR MISC
3 refills | Status: DC
Start: 2023-04-11 — End: 2023-10-01

## 2023-04-11 MED ORDER — SILDENAFIL CITRATE 20 MG PO TABS
20 | ORAL_TABLET | ORAL | 11 refills | Status: AC | PRN
Start: 2023-04-11 — End: ?

## 2023-09-12 NOTE — Telephone Encounter (Signed)
 Pt stated that the pharm sent over a fax about this medication - I'm assuming he needs a PA due to this having 11 refills on it from 04/11/2023?    sildenafil  (REVATIO ) 20 MG tablet [[id number]]

## 2023-09-30 NOTE — Telephone Encounter (Signed)
 Patient calling to request soonest available appointment with Rosaline Los, FNP. Patient states he has been experiencing a fever, fatigue, and persistent headache for a few days. Patient also states he has concerns regarding his blood pressure. Scheduled patient for tomorrow at 9:45 AM in case he needs to take COVID test. Does patient need COVID test before coming into office?

## 2023-09-30 NOTE — Telephone Encounter (Signed)
 Per Rosaline Los, FNP left vm and sent MyChart message requesting COVID test before patient presents in clinic for 08/05 appt. Left number for patient to return call with COVID test results. Informed patient that if he does not wish to take COVID test, his appointment will be rescheduled for virtual visit instead. If test is positive, appointment will be rescheduled for virtual. If negative, he is welcome to come in clinic, but is asked to wear mask. Masks available at check in.

## 2023-10-01 ENCOUNTER — Ambulatory Visit: Admit: 2023-10-01 | Discharge: 2023-10-01 | Payer: Medicaid (Managed Care) | Attending: Family | Primary: Family

## 2023-10-01 ENCOUNTER — Encounter

## 2023-10-01 DIAGNOSIS — E1165 Type 2 diabetes mellitus with hyperglycemia: Principal | ICD-10-CM

## 2023-10-01 LAB — LIPID PANEL
Chol/HDL Ratio: 6.2
Cholesterol, Total: 261 mg/dL — ABNORMAL HIGH (ref 0–200)
HDL: 42 mg/dL (ref 40–60)
LDL Cholesterol: 181 mg/dL
Triglycerides: 187 mg/dL — ABNORMAL HIGH (ref 0–150)
VLDL Cholesterol Calculated: 37 mg/dL

## 2023-10-01 LAB — HEMOGLOBIN A1C
Estimated Avg Glucose: 196 mg/dL
Hemoglobin A1C: 8.4 % — ABNORMAL HIGH (ref 4.0–5.6)

## 2023-10-01 LAB — COMPREHENSIVE METABOLIC PANEL
ALT: 27 U/L (ref 10–50)
AST: 26 U/L (ref 10–50)
Albumin/Globulin Ratio: 1.2 (ref 1.1–2.2)
Albumin: 3.8 g/dL (ref 3.5–5.2)
Alk Phosphatase: 68 U/L (ref 40–129)
Anion Gap: 11 mmol/L (ref 2–14)
BUN/Creatinine Ratio: 15 (ref 12–20)
BUN: 13 mg/dL (ref 8–23)
CO2: 27 mmol/L (ref 20–29)
Calcium: 9.6 mg/dL (ref 8.8–10.2)
Chloride: 100 mmol/L (ref 98–107)
Creatinine: 0.87 mg/dL (ref 0.70–1.20)
Est, Glom Filt Rate: >90 ml/min/1.73m2 (ref >90)
Globulin: 3.3 g/dL (ref 2.0–4.0)
Glucose: 195 mg/dL — ABNORMAL HIGH (ref 65–100)
Potassium: 5.1 mmol/L (ref 3.5–5.1)
Sodium: 138 mmol/L (ref 136–145)
Total Bilirubin: 0.3 mg/dL (ref 0.0–1.2)
Total Protein: 7.1 g/dL (ref 6.4–8.3)

## 2023-10-01 LAB — CBC WITH AUTO DIFFERENTIAL
Basophils %: 1.1 % — ABNORMAL HIGH (ref 0.0–1.0)
Basophils Absolute: 0.07 K/UL (ref 0.00–0.10)
Eosinophils %: 2.6 % (ref 0.0–7.0)
Eosinophils Absolute: 0.16 K/UL (ref 0.00–0.40)
Hematocrit: 46.9 % (ref 36.6–50.3)
Hemoglobin: 16 g/dL (ref 12.1–17.0)
Immature Granulocytes %: 1.1 % — ABNORMAL HIGH (ref 0.0–0.5)
Immature Granulocytes Absolute: 0.07 K/UL — ABNORMAL HIGH (ref 0.00–0.04)
Lymphocytes %: 32.4 % (ref 12.0–49.0)
Lymphocytes Absolute: 1.98 K/UL (ref 0.80–3.50)
MCH: 31.2 pg (ref 26.0–34.0)
MCHC: 34.1 g/dL (ref 30.0–36.5)
MCV: 91.4 FL (ref 80.0–99.0)
MPV: 10.6 FL (ref 8.9–12.9)
Monocytes %: 10.9 % (ref 5.0–13.0)
Monocytes Absolute: 0.67 K/UL (ref 0.00–1.00)
Neutrophils %: 51.9 % (ref 32.0–75.0)
Neutrophils Absolute: 3.17 K/UL (ref 1.80–8.00)
Nucleated RBCs: 0 /100{WBCs}
Platelets: 259 K/uL (ref 150–400)
RBC: 5.13 M/uL (ref 4.10–5.70)
RDW: 12.4 % (ref 11.5–14.5)
WBC: 6.1 K/uL (ref 4.1–11.1)
nRBC: 0 K/uL (ref 0.00–0.01)

## 2023-10-01 LAB — ALBUMIN/CREATININE RATIO, URINE
Albumin Urine: <1.2 mg/dL
Creatinine, Ur: 67.8 mg/dL (ref 39.00–259.00)

## 2023-10-01 LAB — URINE CULTURE HOLD SAMPLE

## 2023-10-01 MED ORDER — EMPAGLIFLOZIN 25 MG PO TABS
25 | ORAL_TABLET | Freq: Every day | ORAL | 2 refills | 75.00000 days | Status: AC
Start: 2023-10-01 — End: ?

## 2023-10-01 MED ORDER — OLMESARTAN MEDOXOMIL-HCTZ 40-25 MG PO TABS
40-25 | ORAL_TABLET | Freq: Every day | ORAL | 1 refills | 90.00000 days | Status: AC
Start: 2023-10-01 — End: ?

## 2023-10-01 MED ORDER — ROSUVASTATIN CALCIUM 20 MG PO TABS
20 | ORAL_TABLET | Freq: Every day | ORAL | 1 refills | 90.00000 days | Status: AC
Start: 2023-10-01 — End: ?

## 2023-10-01 NOTE — Patient Instructions (Signed)
 Start new blood pressure medication daily  Record blood pressure daily goal is less than 140/80.  Follow-up in 1 month.    Continue ibuprofen and hydration  Take a COVID test when you get home.

## 2023-10-01 NOTE — Progress Notes (Signed)
 Frederick Garcia is a 63 y.o. male  Chief Complaint   Patient presents with    Cough     For about a week, reports he had fever up till about 2 or 3 days ago, had been right under 100.  Upset stomach and nausea, did not vomit, has also gotten better       Vitals:    10/01/23 1006 10/01/23 1009   BP: (!) 140/110 (!) 140/98   BP Site: Left Upper Arm Left Upper Arm   Patient Position: Sitting Sitting   BP Cuff Size: Large Adult Large Adult   Pulse: 77    Resp: 18    Temp: 97.9 F (36.6 C)    TempSrc: Temporal    SpO2: 97%    Weight: 105.5 kg (232 lb 9.6 oz)       Wt Readings from Last 3 Encounters:   10/01/23 105.5 kg (232 lb 9.6 oz)   02/01/23 107.7 kg (237 lb 6.4 oz)   11/26/22 105.9 kg (233 lb 6.4 oz)     BMI Readings from Last 3 Encounters:   10/01/23 31.55 kg/m   02/01/23 32.20 kg/m   11/26/22 31.65 kg/m     Health Maintenance Due   Topic Date Due    Diabetic foot exam  Never done    DTaP/Tdap/Td vaccine (1 - Tdap) Never done    Pneumococcal 50+ years Vaccine (1 of 2 - PCV) Never done    Colorectal Cancer Screen  Never done    Shingles vaccine (1 of 2) Never done    Respiratory Syncytial Virus (RSV) Pregnant or age 47 yrs+ (1 - Risk 60-74 years 1-dose series) Never done    COVID-19 Vaccine (1 - 2024-25 season) Never done    Flu vaccine (1) Never done     HPI  Patient is here with a history of 2 to 3 days of fever and viral symptoms.  Did have some nausea.    8 or 10 days temp 100.  Fatigue.  Some sore throat, headache.     Which are now around 100.SABRA  Denies abdominal pain, sore throat.     At 70%     Treating with cough medicine.  Ibuprofen.   BS while sick has been  130-180.        Diabetes- BS at home  not ill.  110    Hemoglobin A1C   Date Value Ref Range Status   12/05/2022 6.8 (H) 4.0 - 5.6 % Final     Comment:     (NOTE)  HbA1C Interpretive Ranges  <5.7              Normal  5.7 - 6.4         Consider Prediabetes  >6.5              Consider Diabetes       Hypertension- took BP meds this am  BP at home 140-/110    Cardiovascular ROS: taking medications as instructed, no medication side effects noted, no TIA's, no chest pain on exertion, no dyspnea on exertion, no swelling of ankles, no palpitations.   No dizziness or headaches.      ROS  Review of Systems   Constitutional:  Positive for fatigue and fever. Negative for activity change and chills.   HENT:  Positive for sore throat.    Eyes:  Negative for visual disturbance.   Respiratory:  Negative for cough and shortness of breath.    Cardiovascular:  Negative for chest pain and palpitations.   Gastrointestinal:  Positive for diarrhea and nausea. Negative for abdominal pain, constipation and vomiting.   Genitourinary:  Negative for dysuria.   Musculoskeletal:  Positive for myalgias. Negative for arthralgias.   Neurological:  Positive for headaches.     EXAM  Physical Exam  Vitals and nursing note reviewed.   Constitutional:       General: He is not in acute distress.     Appearance: Normal appearance.   HENT:      Head: Normocephalic and atraumatic.      Nose: Nose normal.      Mouth/Throat:      Mouth: Mucous membranes are moist.   Eyes:      Extraocular Movements: Extraocular movements intact.      Pupils: Pupils are equal, round, and reactive to light.   Cardiovascular:      Rate and Rhythm: Normal rate and regular rhythm.      Pulses: Normal pulses.      Heart sounds: Normal heart sounds.   Pulmonary:      Effort: Pulmonary effort is normal. No respiratory distress.      Breath sounds: Normal breath sounds.   Musculoskeletal:         General: Normal range of motion.      Cervical back: Normal range of motion and neck supple.   Lymphadenopathy:      Cervical: No cervical adenopathy.   Skin:     General: Skin is warm.   Neurological:      General: No focal deficit present.      Mental Status: He is alert. Mental status is at baseline.   Psychiatric:         Mood and Affect: Mood normal.     ASSESSMENT/PLAN  Zakhari was seen today for cough.    Diagnoses and all orders for  this visit:    Viral illness  -     CBC with Auto Differential; Future  Persistent fever with some gastrointestinal symptoms.  Mildly improved  Continue with hydration and ibuprofen  Recommend a COVID test at home.  Please call if result is positive  Type 2 diabetes mellitus with hyperglycemia, without long-term current use of insulin (HCC)  -     Hemoglobin A1C; Future  -     Comprehensive Metabolic Panel; Future  -     Lipid Panel; Future  -     Albumin/Creatinine Ratio, Urine; Future  Reports blood sugars at home have been slightly elevated with recent viral illness.  Taking medication as directed  Primary hypertension  -     Comprehensive Metabolic Panel; Future  Blood pressure readings elevated  Discontinue lisinopril   Start Benicar  40-25 daily  Take blood pressure daily with a goal of less than 140/80  Return in 1 month for blood pressure follow-up  Other orders  -     olmesartan -hydroCHLOROthiazide (BENICAR  HCT) 40-25 MG per tablet; Take 1 tablet by mouth daily          I have personally spent 40 minutes total time today in preparation, patient care, and documentation for this visit, including the following: review of clinical lab tests; review of medical tests/procedures/services.

## 2023-10-01 NOTE — Progress Notes (Signed)
 I have reviewed all needed documentation in preparation for visit. Verified patient by name and date of birth    Chief Complaint   Patient presents with    Cough     For about a week, reports he had fever up till about 2 or 3 days ago, had been right under 100.  Upset stomach and nausea, did not vomit, has also gotten better       Have you been to the ER, urgent care clinic since your last visit?  Hospitalized since your last visit?   NO    Have you seen or consulted any other health care providers outside our system since your last visit?   NO    "Have you had a colorectal cancer screening such as a colonoscopy/FIT/Cologuard?    NO    No colonoscopy on file  No cologuard on file  No FIT/FOBT on file   No flexible sigmoidoscopy on file            Vitals:    10/01/23 1006   BP: (!) 140/110   BP Site: Left Upper Arm   Patient Position: Sitting   BP Cuff Size: Large Adult   Pulse: 77   Resp: 18   Temp: 97.9 F (36.6 C)   TempSrc: Temporal   SpO2: 97%   Weight: 105.5 kg (232 lb 9.6 oz)       Health Maintenance Due   Topic Date Due    Diabetic foot exam  Never done    DTaP/Tdap/Td vaccine (1 - Tdap) Never done    Pneumococcal 50+ years Vaccine (1 of 2 - PCV) Never done    Colorectal Cancer Screen  Never done    Shingles vaccine (1 of 2) Never done    Respiratory Syncytial Virus (RSV) Pregnant or age 38 yrs+ (1 - Risk 60-74 years 1-dose series) Never done    COVID-19 Vaccine (1 - 2024-25 season) Never done    Flu vaccine (1) Never done       Dawna Axe, LPN

## 2023-10-03 NOTE — Telephone Encounter (Signed)
-----   Message from Rosaline Los, FNP sent at 10/03/2023  9:56 AM EDT -----  Regarding: FW: ECC Message to Provider  Please tell patient I sent message in My Chart Re lab results. M  ----- Message -----  From: Colby Diesel, MA  Sent: 10/03/2023   9:27 AM EDT  To: Rosaline CHRISTELLA Los, FNP  Subject: FW: ECC Message to Provider                        ----- Message -----  From: Rod Janene Burton May-ann  Sent: 10/03/2023   9:05 AM EDT  To: Dwana Candance Aye Clinical Staff  Subject: ECC Message to Provider                          ECC Message to Provider    Relationship to Patient: Self     Additional Information Patient wanted to inform provider that he tested negative for covid and wanted an update on blood .  --------------------------------------------------------------------------------------------------------------------------    Call Back Information: OK to leave message on voicemail  Preferred Call Back Number: Phone [phone]

## 2023-10-03 NOTE — Telephone Encounter (Signed)
 Called and spoke with pt.  Verified identity x2.    Relayed Michelle's message Your labs show worsening lipid panel.   The 10-year ASCVD risk score (Arnett DK, et al., 2019) is: 31.4%  You need to be on a high dose statin. I am sending Crestor  to the pharmacy. Continue Mevacor.  Diabetes is also uncontrolled. Continue Metformin  and Farxiga . I am adding Jardiance  to take daily.  I will see you on Sept 8th in follow up.  Pt verbalized understanding.    Dawna Axe, LPN

## 2023-10-04 MED ORDER — TRULICITY 0.75 MG/0.5ML SC SOAJ
0.75 | SUBCUTANEOUS | 0 refills | 84.00000 days | Status: DC
Start: 2023-10-04 — End: 2023-10-24

## 2023-10-04 NOTE — Telephone Encounter (Signed)
 Refill request received from [facility] for   Requested Prescriptions     Pending Prescriptions Disp Refills    TRULICITY  0.75 MG/0.5ML SOAJ SC injection [Pharmacy Med Name: Trulicity  0.75 MG/0.5ML Subcutaneous Solution Pen-injector] 4 mL 0     Sig: INJECT 1/2 (ONE-HALF) ML SUBCUTANEOUSLY  ONCE A WEEK     Last office visit: 10/01/2023   Next office visit: 11/04/2023     Routed to Rosaline Los, FNP for review.

## 2023-10-24 MED ORDER — TRULICITY 0.75 MG/0.5ML SC SOAJ
0.75 | SUBCUTANEOUS | 0 refills | 28.00000 days | Status: DC
Start: 2023-10-24 — End: 2023-12-03

## 2023-10-30 NOTE — Progress Notes (Unsigned)
 Patient's HM shows they are overdue for Colorectal Screening.   Care Everywhere and [employer] files searched.  No results to attach to order nor HM updated.     Fax request sent to [facility] for most recent colonoscopy report.

## 2023-11-04 ENCOUNTER — Ambulatory Visit: Payer: Medicaid (Managed Care) | Attending: Family | Primary: Family

## 2023-12-03 MED ORDER — TRULICITY 0.75 MG/0.5ML SC SOAJ
0.75 | SUBCUTANEOUS | 4 refills | 28.00000 days | Status: DC
Start: 2023-12-03 — End: 2024-01-27

## 2023-12-03 NOTE — Telephone Encounter (Signed)
"  Refill request received from [facility]    for   Requested Prescriptions     Pending Prescriptions Disp Refills    TRULICITY  0.75 MG/0.5ML SOAJ SC injection [Pharmacy Med Name: Trulicity  0.75 MG/0.5ML Subcutaneous Solution Pen-injector] 4 mL 0     Sig: INJECT 1 PEN SUBCUTANEOUSLY ONCE A WEEK     Last office visit: 10/01/2023   Next office visit: Visit date not found     Routed to Rosaline Los, FNP for review.     Dawna Axe, LPN  "

## 2024-01-27 MED ORDER — DULAGLUTIDE 1.5 MG/0.5ML SC SOAJ
1.5 | SUBCUTANEOUS | 3 refills | 28.00000 days | Status: AC
Start: 2024-01-27 — End: ?

## 2024-03-05 MED ORDER — VITAMIN D (ERGOCALCIFEROL) 1.25 MG (50000 UT) PO CAPS
1.25 | ORAL_CAPSULE | ORAL | 2 refills | 84.00000 days | Status: AC
Start: 2024-03-05 — End: ?

## 2024-03-05 NOTE — Telephone Encounter (Signed)
.  Refill request received from [facility]    for   Requested Prescriptions     Pending Prescriptions Disp Refills    vitamin D  (ERGOCALCIFEROL ) 1.25 MG (50000 UT) CAPS capsule [Pharmacy Med Name: Vitamin D  (Ergocalciferol ) 1.25 MG (50000 UT) Oral Capsule] 12 capsule 0     Sig: Take 1 capsule by mouth once a week     Last office visit: 10/01/2023   Next office visit: Visit date not found     Routed to Rosaline Los, FNP for review.     Tran, Julie CMA
